# Patient Record
Sex: Male | Born: 1940
Health system: Southern US, Community
[De-identification: ages and names within clinical notes are randomized; demographics above are authoritative.]

## PROBLEM LIST (undated history)

## (undated) DIAGNOSIS — I252 Old myocardial infarction: Secondary | ICD-10-CM

## (undated) DIAGNOSIS — I2510 Atherosclerotic heart disease of native coronary artery without angina pectoris: Secondary | ICD-10-CM

## (undated) DIAGNOSIS — E785 Hyperlipidemia, unspecified: Secondary | ICD-10-CM

## (undated) DIAGNOSIS — Z9289 Personal history of other medical treatment: Secondary | ICD-10-CM

## (undated) HISTORY — PX: EYE SURGERY: SHX253

## (undated) HISTORY — DX: Old myocardial infarction: I25.2

## (undated) HISTORY — PX: BREAST SURGERY: SHX581

## (undated) HISTORY — DX: Hyperlipidemia, unspecified: E78.5

## (undated) HISTORY — DX: Personal history of other medical treatment: Z92.89

---

## 2011-03-21 ENCOUNTER — Ambulatory Visit (INDEPENDENT_AMBULATORY_CARE_PROVIDER_SITE_OTHER): Payer: PRIVATE HEALTH INSURANCE

## 2011-03-21 DIAGNOSIS — J019 Acute sinusitis, unspecified: Secondary | ICD-10-CM

## 2011-03-21 DIAGNOSIS — J209 Acute bronchitis, unspecified: Secondary | ICD-10-CM

## 2011-03-21 DIAGNOSIS — H6120 Impacted cerumen, unspecified ear: Secondary | ICD-10-CM

## 2012-01-19 ENCOUNTER — Ambulatory Visit (INDEPENDENT_AMBULATORY_CARE_PROVIDER_SITE_OTHER): Payer: Medicare Other | Admitting: Family Medicine

## 2012-01-19 VITALS — BP 142/80 | HR 61 | Temp 98.1°F | Resp 16 | Ht 68.5 in | Wt 208.0 lb

## 2012-01-19 DIAGNOSIS — S161XXA Strain of muscle, fascia and tendon at neck level, initial encounter: Secondary | ICD-10-CM

## 2012-01-19 DIAGNOSIS — S139XXA Sprain of joints and ligaments of unspecified parts of neck, initial encounter: Secondary | ICD-10-CM

## 2012-01-19 DIAGNOSIS — M542 Cervicalgia: Secondary | ICD-10-CM

## 2012-01-19 MED ORDER — METAXALONE 800 MG PO TABS: ORAL_TABLET | ORAL | Status: DC

## 2012-01-19 MED ORDER — HYDROCODONE-ACETAMINOPHEN 5-500 MG PO TABS: 1.0000 | ORAL_TABLET | ORAL | Status: DC | PRN

## 2012-01-19 NOTE — Patient Instructions (Addendum)
Heat and ice  Pain meds if needed  Aleve 1 or 2 twice daily  Muscle relaxant 1/2 to 1 twice daily and 1 at bedtime       Cervical Sprain A cervical sprain is when the ligaments in the neck stretch or tear. The ligaments are the tissues that hold the neck bones in place. HOME CARE   Put ice on the injured area.  Put ice in a plastic bag.  Place a towel between your skin and the bag.  Leave the ice on for 15 to 20 minutes, 3 to 4 times a day.  Only take medicine as told by your doctor.  Keep all doctor visits as told.  Keep all physical therapy visits as told.  If your doctor gives you a neck collar, wear it as told.  Do not drive while wearing a neck collar.  Adjust your work station so that you have good posture while you work.  Avoid positions and activities that make your problems worse.  Warm up and stretch before being active. GET HELP RIGHT AWAY IF:   You are bleeding or your stomach is upset.  You have an allergic reaction to your medicine.  Your problems (symptoms) get worse.  You develop new problems.  You lose feeling (numbness) or you cannot move (paralysis) any part of your body.  You have tingling or weakness in any part of your body.  Your pain is not controlled with medicine.  You cannot take less pain medicine over time as planned.  Your activity level does not improve as expected. MAKE SURE YOU:   Understand these instructions.  Will watch your condition.  Will get help right away if you are not doing well or get worse. Document Released: 09/17/2007 Document Revised: 06/23/2011 Document Reviewed: 01/02/2011 [facility] Patient Information 2013 [place], [place].
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
## 2012-02-10 ENCOUNTER — Ambulatory Visit (INDEPENDENT_AMBULATORY_CARE_PROVIDER_SITE_OTHER): Payer: Medicare Other | Admitting: Family Medicine

## 2012-02-10 VITALS — BP 139/74 | HR 60 | Temp 97.9°F | Resp 16 | Ht 68.5 in | Wt 209.2 lb

## 2012-02-10 DIAGNOSIS — J029 Acute pharyngitis, unspecified: Secondary | ICD-10-CM

## 2012-02-10 MED ORDER — AMOXICILLIN 875 MG PO TABS: 875.0000 mg | ORAL_TABLET | Freq: Two times a day (BID) | ORAL | Status: DC
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
## 2012-02-12 LAB — CULTURE, GROUP A STREP: Organism ID, Bacteria: NORMAL

## 2012-03-03 ENCOUNTER — Ambulatory Visit (INDEPENDENT_AMBULATORY_CARE_PROVIDER_SITE_OTHER): Payer: Medicare Other | Admitting: Family Medicine

## 2012-03-03 ENCOUNTER — Ambulatory Visit: Payer: Medicare Other

## 2012-03-03 VITALS — BP 145/90 | HR 77 | Temp 98.4°F | Resp 18 | Wt 214.0 lb

## 2012-03-03 DIAGNOSIS — S139XXA Sprain of joints and ligaments of unspecified parts of neck, initial encounter: Secondary | ICD-10-CM

## 2012-03-03 DIAGNOSIS — M545 Low back pain, unspecified: Secondary | ICD-10-CM

## 2012-03-03 DIAGNOSIS — M542 Cervicalgia: Secondary | ICD-10-CM

## 2012-03-03 DIAGNOSIS — S161XXA Strain of muscle, fascia and tendon at neck level, initial encounter: Secondary | ICD-10-CM

## 2012-03-03 DIAGNOSIS — M47812 Spondylosis without myelopathy or radiculopathy, cervical region: Secondary | ICD-10-CM

## 2012-03-03 MED ORDER — OXAPROZIN 600 MG PO TABS: ORAL_TABLET | ORAL | Status: DC

## 2012-03-03 NOTE — Patient Instructions (Addendum)
Do passive range of motion exercises stretching the neck.  Flexeril 5 mg at bedtime for muscle relaxation  Daypro one twice daily for pain and inflammation  (do not take ibuprofen or naproxen while on the Daypro)  May take tylenof for additional pain relief
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
## 2012-06-10 ENCOUNTER — Ambulatory Visit (INDEPENDENT_AMBULATORY_CARE_PROVIDER_SITE_OTHER): Payer: Medicare Other | Admitting: Emergency Medicine

## 2012-06-10 VITALS — BP 140/82 | HR 94 | Temp 99.1°F | Resp 16 | Ht 67.0 in | Wt 220.4 lb

## 2012-06-10 DIAGNOSIS — R05 Cough: Secondary | ICD-10-CM

## 2012-06-10 DIAGNOSIS — M1990 Unspecified osteoarthritis, unspecified site: Secondary | ICD-10-CM

## 2012-06-10 DIAGNOSIS — E785 Hyperlipidemia, unspecified: Secondary | ICD-10-CM | POA: Insufficient documentation

## 2012-06-10 DIAGNOSIS — J209 Acute bronchitis, unspecified: Secondary | ICD-10-CM

## 2012-06-10 MED ORDER — AZITHROMYCIN 250 MG PO TABS: ORAL_TABLET | ORAL | Status: DC

## 2012-06-10 NOTE — Patient Instructions (Addendum)

## 2012-06-10 NOTE — Progress Notes (Signed)
  Subjective:    Patient ID: Lance Hancock, male    DOB: [birth year], 72 y.o.   MRN: [id number]  HPI Pt presents to clinic today with congestion in head, cough, and drainage. These symptoms started about 5 days ago. Pt states his cough is somewhat productive.    Review of Systems     Objective:   Physical Exam HEENT exam is unremarkable. Neck supple. Chest exam reveals rhonchi but no rales no dullness .        Assessment & Plan:  Patient has cough syrup at home. He will be on a Z-Pak for infection.

## 2012-07-08 ENCOUNTER — Ambulatory Visit (INDEPENDENT_AMBULATORY_CARE_PROVIDER_SITE_OTHER): Payer: Medicare Other | Admitting: Emergency Medicine

## 2012-07-08 ENCOUNTER — Ambulatory Visit: Payer: Medicare Other

## 2012-07-08 VITALS — BP 154/71 | HR 76 | Temp 98.2°F | Resp 16 | Ht 68.0 in | Wt 218.0 lb

## 2012-07-08 DIAGNOSIS — S335XXA Sprain of ligaments of lumbar spine, initial encounter: Secondary | ICD-10-CM

## 2012-07-08 DIAGNOSIS — S139XXA Sprain of joints and ligaments of unspecified parts of neck, initial encounter: Secondary | ICD-10-CM

## 2012-07-08 MED ORDER — CYCLOBENZAPRINE HCL 10 MG PO TABS: 10.0000 mg | ORAL_TABLET | Freq: Three times a day (TID) | ORAL | Status: DC | PRN

## 2012-07-08 MED ORDER — NAPROXEN SODIUM 550 MG PO TABS: 550.0000 mg | ORAL_TABLET | Freq: Two times a day (BID) | ORAL | Status: AC
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
## 2012-07-08 NOTE — Patient Instructions (Addendum)

## 2013-01-13 ENCOUNTER — Ambulatory Visit (INDEPENDENT_AMBULATORY_CARE_PROVIDER_SITE_OTHER): Payer: Medicare Other | Admitting: Family Medicine

## 2013-01-13 DIAGNOSIS — Z23 Encounter for immunization: Secondary | ICD-10-CM

## 2013-04-16 IMAGING — CR DG CERVICAL SPINE COMPLETE 4+V
5 series · 5 of 5 positions shown · non-contrast
Comparison: None.

CLINICAL DATA: Neck and mid back pain.

CERVICAL SPINE - COMPLETE 4+ VIEW

[lpo]
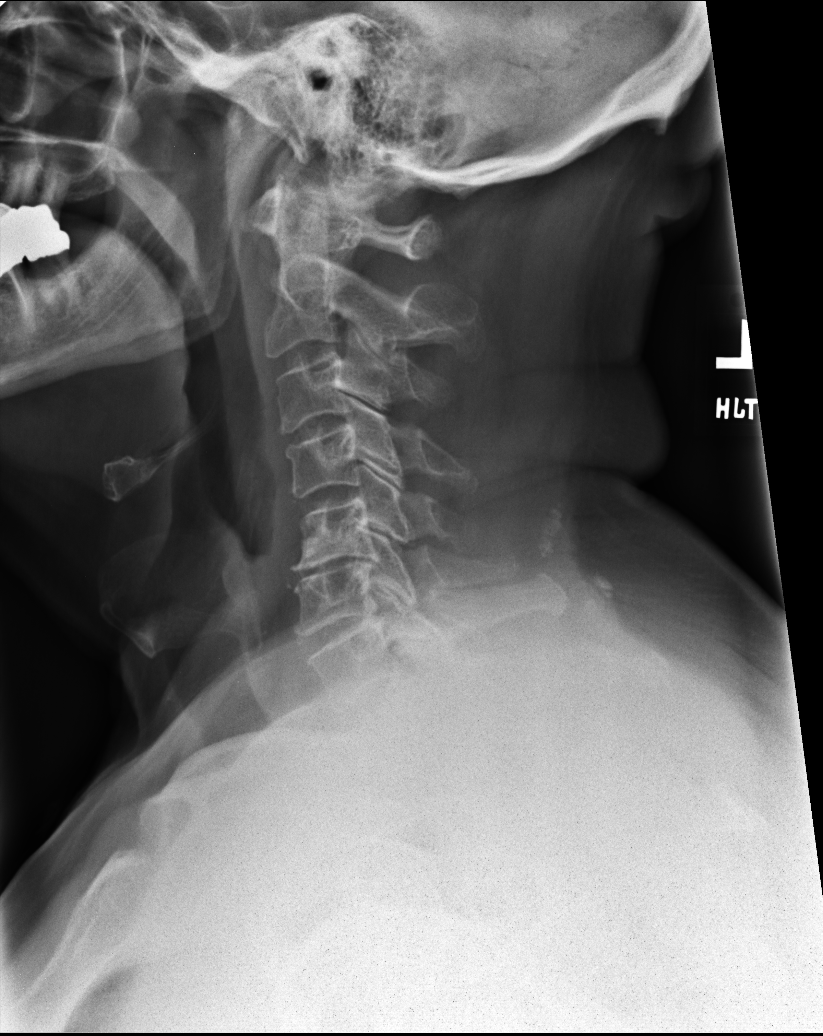

[lateral]
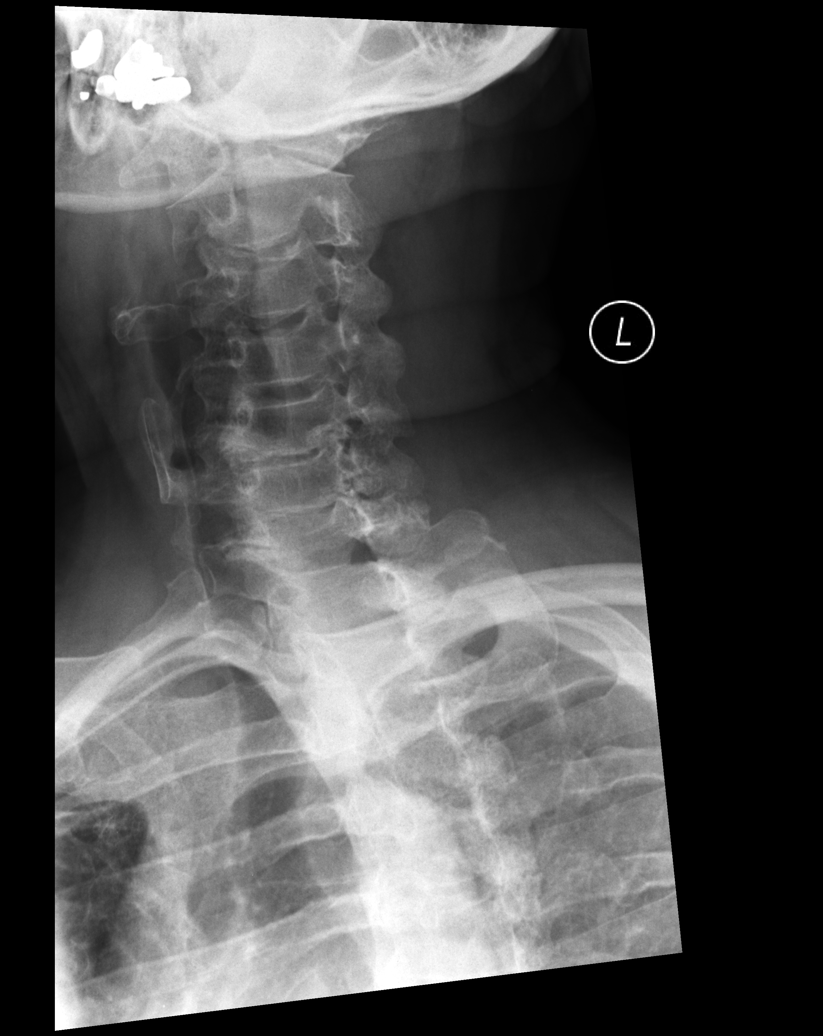

[rpo]
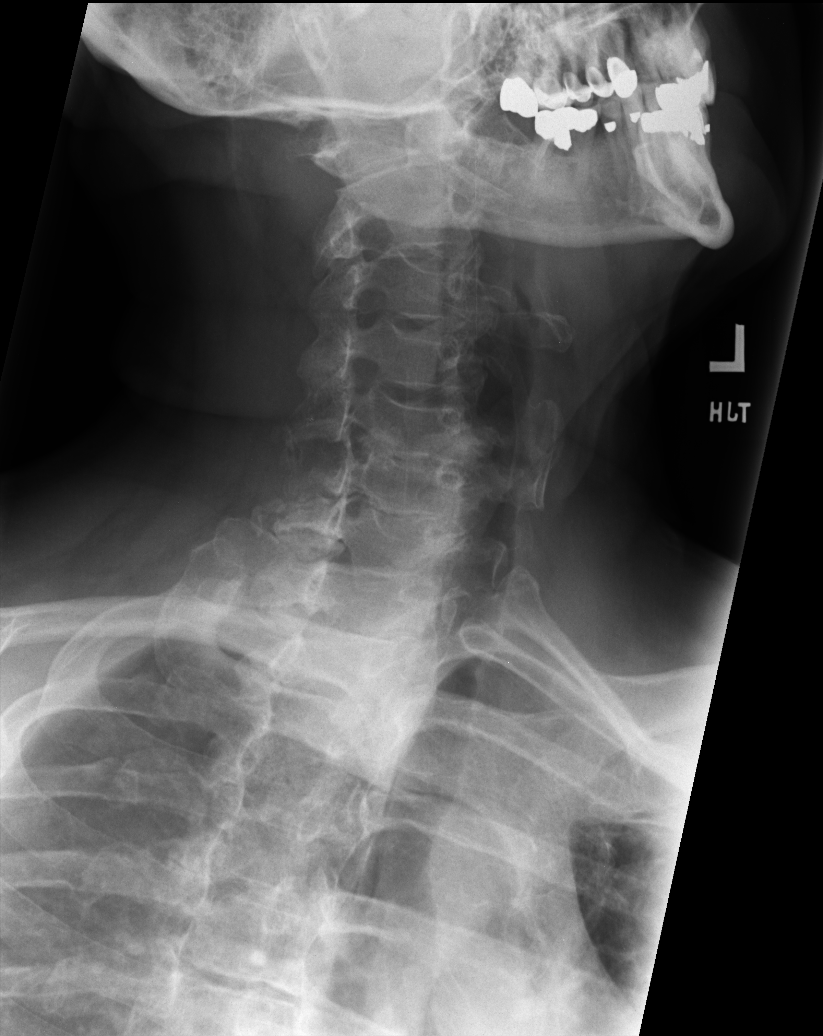

[AP]
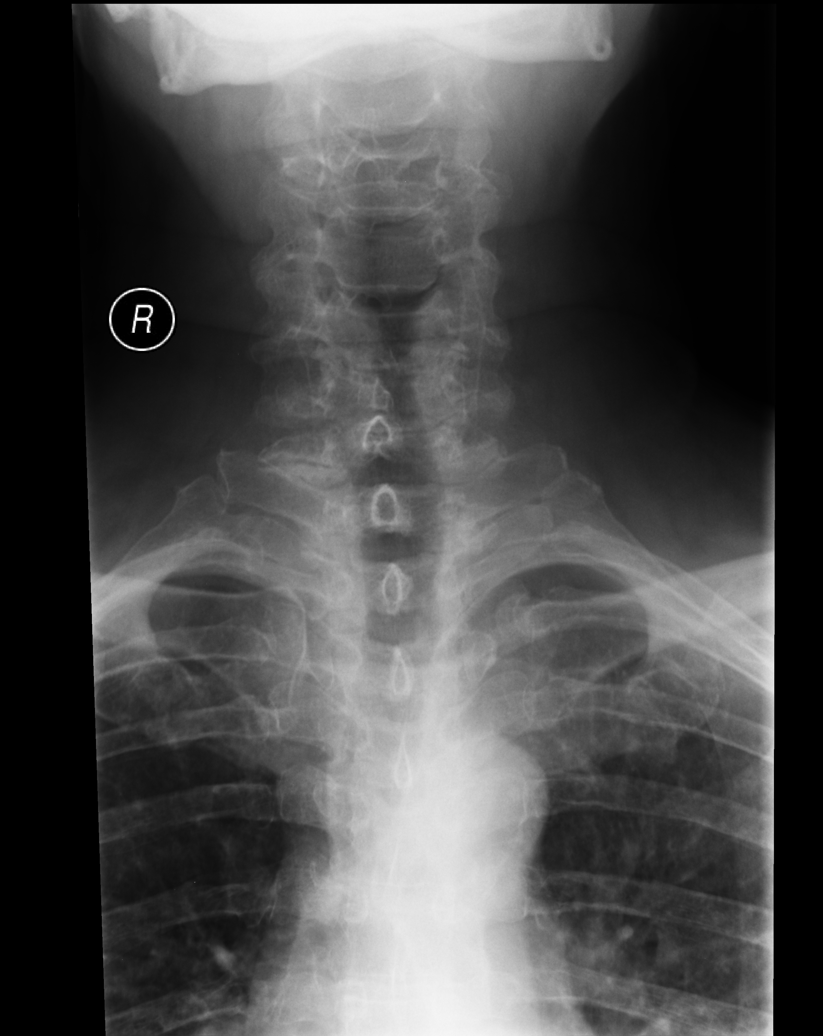

[ap open mouth]
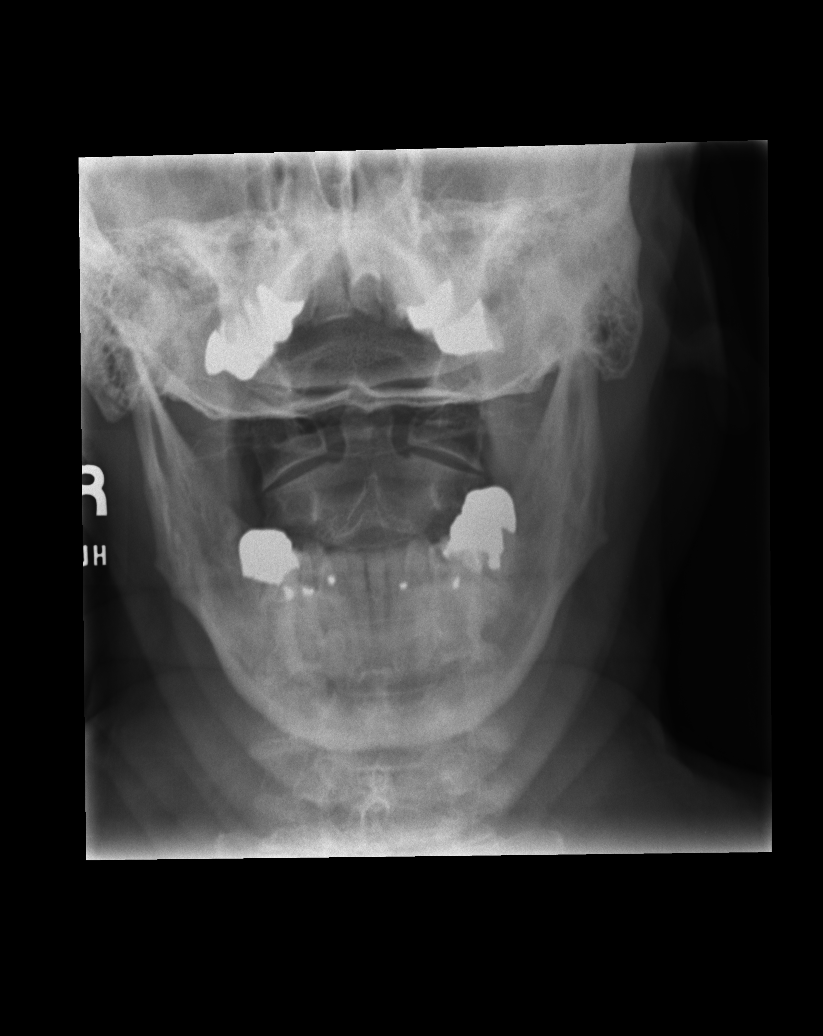

[5 of 5 positions shown; findings below may reference images not displayed]

FINDINGS: There is degenerative disc disease at C5-6 with 2 mm
retrolisthesis of C5 on C6. There is bilateral foraminal stenosis
at C5-6 and C6-7 due to uncinate spurs.  Severe right facet
arthritis at C7-T1.  Calcifications in the nuchal ligament at C6
and C7 are not significant.

No prevertebral soft tissue swelling.
IMPRESSION: Degenerative disc and joint disease in the lower cervical spine
with bilateral foraminal stenosis at C5-6 and C6-7.

## 2013-04-16 IMAGING — CR DG THORACIC SPINE 2V
2 series · 2 of 2 positions shown · non-contrast
Comparison: Chest x-ray dated 06/04/2008

CLINICAL DATA: Mid back pain and neck pain.

THORACIC SPINE - 2 VIEW

[AP]
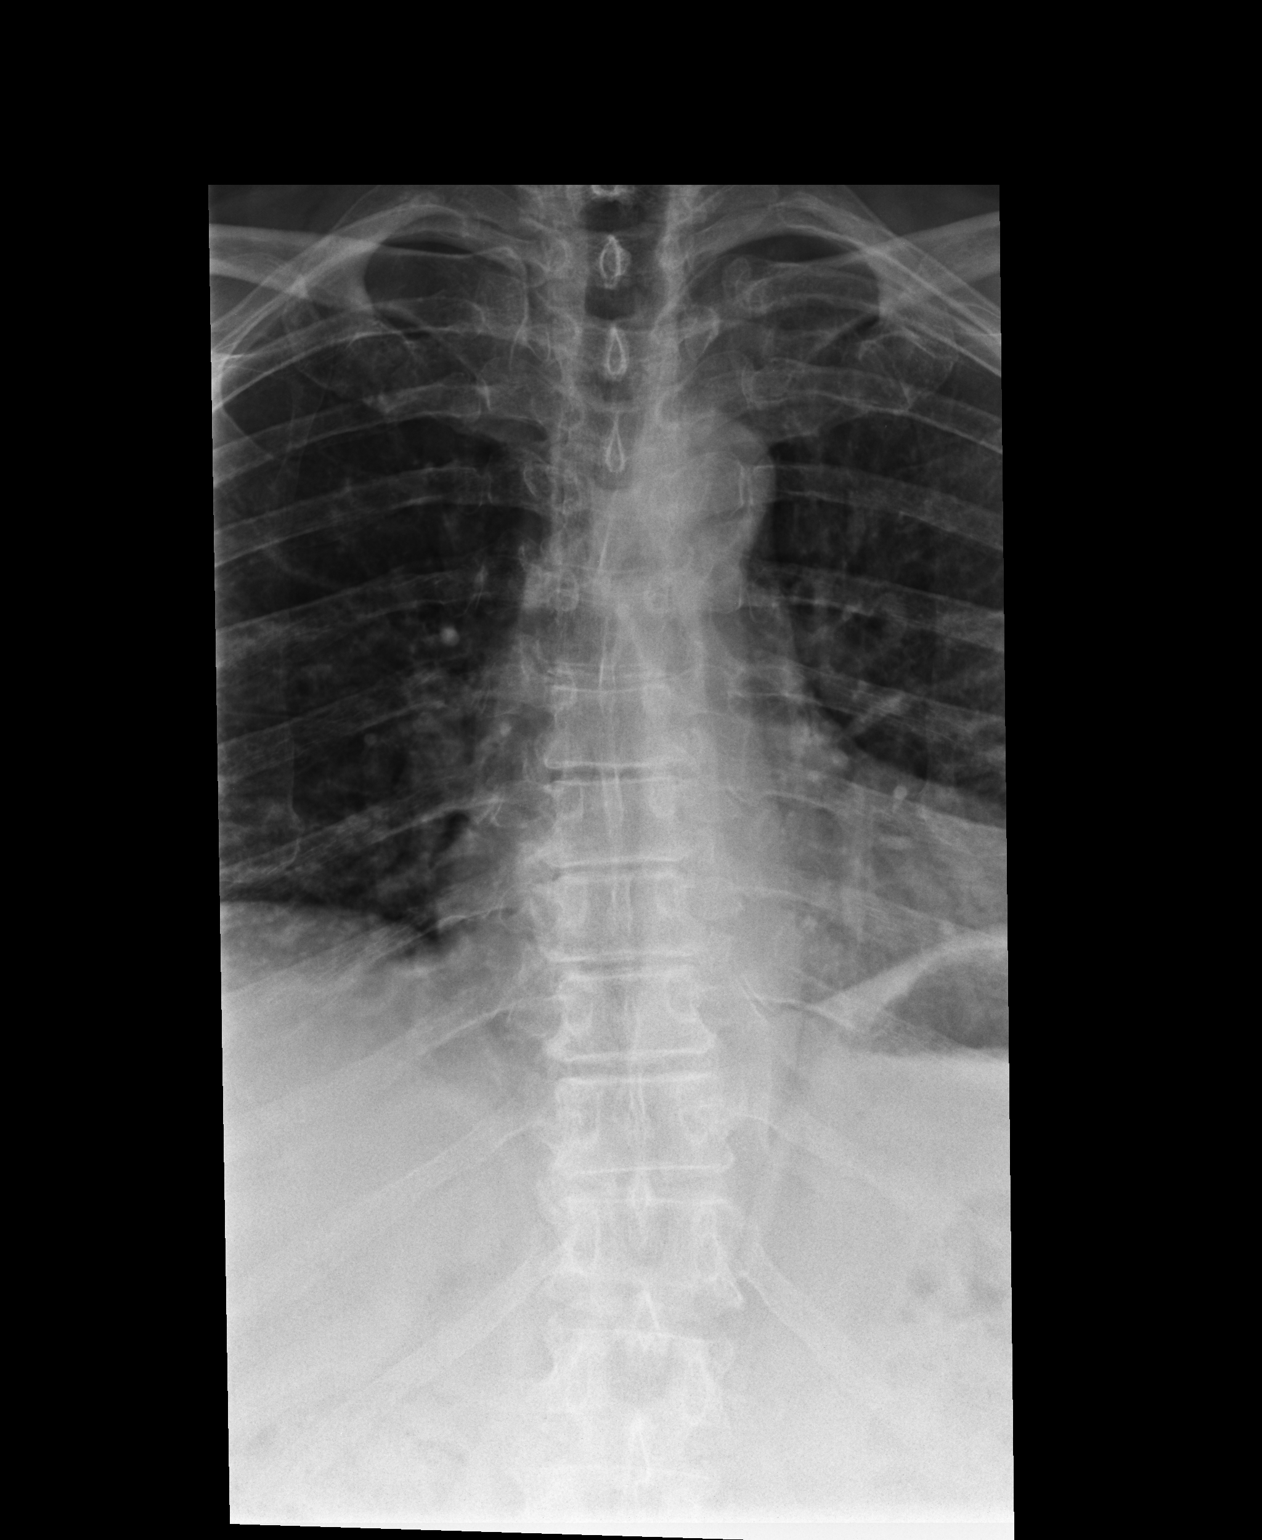

[lateral]
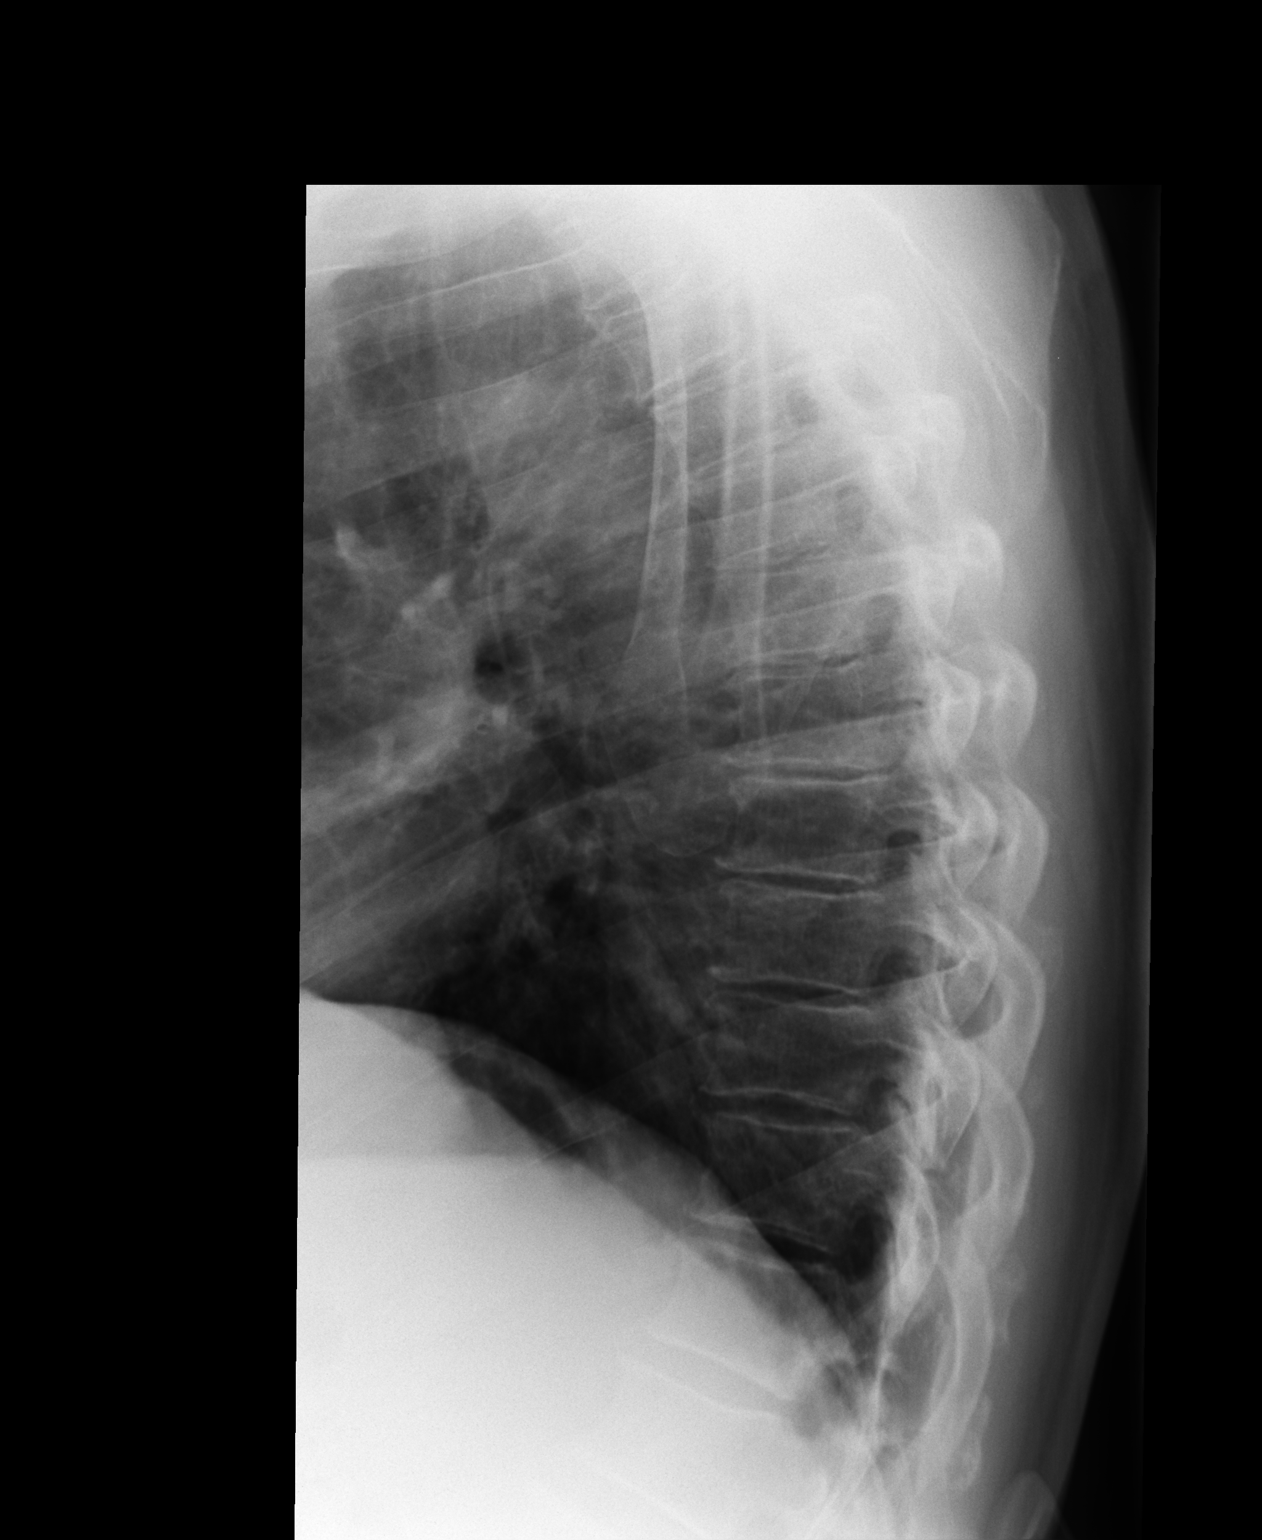

[2 of 2 positions shown; findings below may reference images not displayed]

FINDINGS: There is a minimal thoracolumbar scoliosis, unchanged.
There is no disc space narrowing, bone destruction, fracture, or
other significant abnormality.
IMPRESSION: No significant abnormality of the thoracic spine.  Severe right
facet arthritis at C7-T1.

## 2015-08-15 ENCOUNTER — Encounter (HOSPITAL_COMMUNITY): Payer: Self-pay | Admitting: Emergency Medicine

## 2015-08-15 ENCOUNTER — Inpatient Hospital Stay (HOSPITAL_COMMUNITY)
Admission: AD | Admit: 2015-08-15 | Discharge: 2015-08-16 | DRG: 247 | Disposition: A | Discharge status: Home / Self Care | Payer: Medicare HMO | Attending: Cardiovascular Disease | Admitting: Cardiovascular Disease

## 2015-08-15 ENCOUNTER — Ambulatory Visit (HOSPITAL_COMMUNITY): Admit: 2015-08-15 | Payer: Self-pay | Admitting: Cardiovascular Disease

## 2015-08-15 ENCOUNTER — Encounter (HOSPITAL_COMMUNITY)
Admission: AD | Disposition: A | Discharge status: Home / Self Care | Payer: Self-pay | Source: Home / Self Care | Attending: Cardiovascular Disease

## 2015-08-15 DIAGNOSIS — Z79899 Other long term (current) drug therapy: Secondary | ICD-10-CM | POA: Diagnosis not present

## 2015-08-15 DIAGNOSIS — I252 Old myocardial infarction: Secondary | ICD-10-CM

## 2015-08-15 DIAGNOSIS — I2510 Atherosclerotic heart disease of native coronary artery without angina pectoris: Secondary | ICD-10-CM

## 2015-08-15 DIAGNOSIS — Z955 Presence of coronary angioplasty implant and graft: Secondary | ICD-10-CM

## 2015-08-15 DIAGNOSIS — I213 ST elevation (STEMI) myocardial infarction of unspecified site: Secondary | ICD-10-CM

## 2015-08-15 DIAGNOSIS — I2111 ST elevation (STEMI) myocardial infarction involving right coronary artery: Secondary | ICD-10-CM

## 2015-08-15 DIAGNOSIS — I2119 ST elevation (STEMI) myocardial infarction involving other coronary artery of inferior wall: Secondary | ICD-10-CM | POA: Diagnosis present

## 2015-08-15 DIAGNOSIS — Z7982 Long term (current) use of aspirin: Secondary | ICD-10-CM | POA: Diagnosis not present

## 2015-08-15 DIAGNOSIS — Z87891 Personal history of nicotine dependence: Secondary | ICD-10-CM | POA: Diagnosis not present

## 2015-08-15 DIAGNOSIS — E785 Hyperlipidemia, unspecified: Secondary | ICD-10-CM | POA: Diagnosis present

## 2015-08-15 DIAGNOSIS — R001 Bradycardia, unspecified: Secondary | ICD-10-CM | POA: Diagnosis present

## 2015-08-15 HISTORY — DX: Old myocardial infarction: I25.2

## 2015-08-15 HISTORY — PX: CARDIAC CATHETERIZATION: SHX172

## 2015-08-15 HISTORY — DX: Atherosclerotic heart disease of native coronary artery without angina pectoris: I25.10

## 2015-08-15 LAB — POCT I-STAT, CHEM 8
BUN: 24 mg/dL — ABNORMAL HIGH (ref 6–20)
Calcium, Ion: 1.03 mmol/L — ABNORMAL LOW (ref 1.13–1.30)
Chloride: 103 mmol/L (ref 101–111)
Creatinine, Ser: 0.9 mg/dL (ref 0.61–1.24)
GLUCOSE: 122 mg/dL — AB (ref 65–99)
HCT: 45 % (ref 39.0–52.0)
HEMOGLOBIN: 15.3 g/dL (ref 13.0–17.0)
POTASSIUM: 3.2 mmol/L — AB (ref 3.5–5.1)
SODIUM: 138 mmol/L (ref 135–145)
TCO2: 20 mmol/L (ref 0–100)

## 2015-08-15 LAB — BASIC METABOLIC PANEL
ANION GAP: 8 (ref 5–15)
Anion gap: 12 (ref 5–15)
BUN: 23 mg/dL — ABNORMAL HIGH (ref 6–20)
BUN: UNDETERMINED mg/dL (ref 6–20)
BUN: 14 mg/dL (ref 6–20)
CALCIUM: 8.7 mg/dL — AB (ref 8.9–10.3)
CALCIUM: 8.8 mg/dL — AB (ref 8.9–10.3)
CALCIUM: UNDETERMINED mg/dL (ref 8.9–10.3)
CO2: 21 mmol/L — ABNORMAL LOW (ref 22–32)
CO2: UNDETERMINED mmol/L (ref 22–32)
CO2: 24 mmol/L (ref 22–32)
Chloride: 104 mmol/L (ref 101–111)
Chloride: UNDETERMINED mmol/L (ref 101–111)
Chloride: 106 mmol/L (ref 101–111)
Creatinine, Ser: 0.96 mg/dL (ref 0.61–1.24)
Creatinine, Ser: 0.82 mg/dL (ref 0.61–1.24)
Creatinine, Ser: 0.9 mg/dL (ref 0.61–1.24)
GLUCOSE: 111 mg/dL — AB (ref 65–99)
Glucose, Bld: 129 mg/dL — ABNORMAL HIGH (ref 65–99)
Glucose, Bld: 123 mg/dL — ABNORMAL HIGH (ref 65–99)
POTASSIUM: 3.3 mmol/L — AB (ref 3.5–5.1)
Potassium: UNDETERMINED mmol/L (ref 3.5–5.1)
Potassium: 3.8 mmol/L (ref 3.5–5.1)
SODIUM: UNDETERMINED mmol/L (ref 135–145)
SODIUM: 138 mmol/L (ref 135–145)
Sodium: 137 mmol/L (ref 135–145)

## 2015-08-15 LAB — TROPONIN I
TROPONIN I: 6.42 ng/mL — AB (ref <0.031)
Troponin I: 17.31 ng/mL (ref <0.031)
Troponin I: 12.1 ng/mL (ref <0.031)

## 2015-08-15 LAB — POCT ACTIVATED CLOTTING TIME
Activated Clotting Time: 214 seconds
Activated Clotting Time: 312 seconds

## 2015-08-15 LAB — CBC WITH DIFFERENTIAL/PLATELET
BASOS ABS: 0 10*3/uL (ref 0.0–0.1)
BASOS PCT: 0 %
Eosinophils Absolute: 0.1 10*3/uL (ref 0.0–0.7)
Eosinophils Relative: 2 %
HEMATOCRIT: 42.9 % (ref 39.0–52.0)
HEMOGLOBIN: 14.5 g/dL (ref 13.0–17.0)
LYMPHS PCT: 48 %
Lymphs Abs: 3.6 10*3/uL (ref 0.7–4.0)
MCH: 30 pg (ref 26.0–34.0)
MCHC: 33.8 g/dL (ref 30.0–36.0)
MCV: 88.8 fL (ref 78.0–100.0)
MONO ABS: 0.7 10*3/uL (ref 0.1–1.0)
Monocytes Relative: 10 %
NEUTROS ABS: 3 10*3/uL (ref 1.7–7.7)
NEUTROS PCT: 40 %
Platelets: 162 10*3/uL (ref 150–400)
RBC: 4.83 MIL/uL (ref 4.22–5.81)
RDW: 13.1 % (ref 11.5–15.5)
WBC: 7.5 10*3/uL (ref 4.0–10.5)

## 2015-08-15 LAB — LIPID PANEL
Cholesterol: 138 mg/dL (ref 0–200)
HDL: 43 mg/dL (ref >40)
LDL Cholesterol: 90 mg/dL (ref 0–99)
TRIGLYCERIDES: 26 mg/dL (ref <150)
Total CHOL/HDL Ratio: 3.2 RATIO
VLDL: 5 mg/dL (ref 0–40)

## 2015-08-15 LAB — CBC
HCT: 44.1 % (ref 39.0–52.0)
Hemoglobin: 15.3 g/dL (ref 13.0–17.0)
MCH: 31.4 pg (ref 26.0–34.0)
MCHC: 34.7 g/dL (ref 30.0–36.0)
MCV: 90.6 fL (ref 78.0–100.0)
Platelets: 167 10*3/uL (ref 150–400)
RBC: 4.87 MIL/uL (ref 4.22–5.81)
RDW: 13.3 % (ref 11.5–15.5)
WBC: 9.9 10*3/uL (ref 4.0–10.5)

## 2015-08-15 LAB — POCT I-STAT TROPONIN I: TROPONIN I, POC: 0.02 ng/mL (ref 0.00–0.08)

## 2015-08-15 LAB — TSH: TSH: 1.889 u[IU]/mL (ref 0.350–4.500)

## 2015-08-15 LAB — PROTIME-INR
INR: 1.1 (ref 0.00–1.49)
Prothrombin Time: 14.4 seconds (ref 11.6–15.2)

## 2015-08-15 LAB — MRSA PCR SCREENING: MRSA BY PCR: POSITIVE — AB

## 2015-08-15 LAB — APTT: APTT: 25 s (ref 24–37)

## 2015-08-15 SURGERY — LEFT HEART CATH AND CORONARY ANGIOGRAPHY

## 2015-08-15 MED ORDER — IOPAMIDOL (ISOVUE-370) INJECTION 76%
INTRAVENOUS | Status: AC
  Filled 2015-08-15: qty 125

## 2015-08-15 MED ORDER — TICAGRELOR 90 MG PO TABS
ORAL_TABLET | ORAL | Status: DC | PRN
  Administered 2015-08-15: 180 mg via ORAL

## 2015-08-15 MED ORDER — TIROFIBAN HCL IN NACL 5-0.9 MG/100ML-% IV SOLN
INTRAVENOUS | Status: AC
  Filled 2015-08-15: qty 100

## 2015-08-15 MED ORDER — CHLORHEXIDINE GLUCONATE CLOTH 2 % EX PADS
6.0000 | MEDICATED_PAD | Freq: Every day | CUTANEOUS | Status: DC
  Administered 2015-08-16: 6 via TOPICAL

## 2015-08-15 MED ORDER — SODIUM CHLORIDE 0.9% FLUSH: 3.0000 mL | Freq: Two times a day (BID) | INTRAVENOUS | Status: DC

## 2015-08-15 MED ORDER — OXYCODONE-ACETAMINOPHEN 5-325 MG PO TABS: 1.0000 | ORAL_TABLET | ORAL | Status: DC | PRN

## 2015-08-15 MED ORDER — FENTANYL CITRATE (PF) 100 MCG/2ML IJ SOLN
INTRAMUSCULAR | Status: DC | PRN
  Administered 2015-08-15: 25 ug via INTRAVENOUS

## 2015-08-15 MED ORDER — VERAPAMIL HCL 2.5 MG/ML IV SOLN
INTRAVENOUS | Status: AC
  Filled 2015-08-15: qty 2

## 2015-08-15 MED ORDER — VERAPAMIL HCL 2.5 MG/ML IV SOLN
INTRAVENOUS | Status: DC | PRN
  Administered 2015-08-15: 01:00:00 via INTRA_ARTERIAL

## 2015-08-15 MED ORDER — CYCLOBENZAPRINE HCL 10 MG PO TABS: 10.0000 mg | ORAL_TABLET | Freq: Three times a day (TID) | ORAL | Status: DC | PRN

## 2015-08-15 MED ORDER — HEPARIN SODIUM (PORCINE) 5000 UNIT/ML IJ SOLN
5000.0000 [IU] | Freq: Three times a day (TID) | INTRAMUSCULAR | Status: DC
  Administered 2015-08-15 – 2015-08-16 (×4): 5000 [IU] via SUBCUTANEOUS
  Filled 2015-08-15 (×4): qty 1

## 2015-08-15 MED ORDER — IOPAMIDOL (ISOVUE-370) INJECTION 76%
INTRAVENOUS | Status: AC
  Filled 2015-08-15: qty 50

## 2015-08-15 MED ORDER — ASPIRIN 81 MG PO CHEW
81.0000 mg | CHEWABLE_TABLET | Freq: Every day | ORAL | Status: DC
  Administered 2015-08-15 – 2015-08-16 (×2): 81 mg via ORAL
  Filled 2015-08-15 (×2): qty 1

## 2015-08-15 MED ORDER — ATROPINE SULFATE 1 MG/10ML IJ SOSY
PREFILLED_SYRINGE | INTRAMUSCULAR | Status: AC
  Filled 2015-08-15: qty 10

## 2015-08-15 MED ORDER — ATORVASTATIN CALCIUM 80 MG PO TABS
80.0000 mg | ORAL_TABLET | Freq: Every day | ORAL | Status: DC
  Administered 2015-08-15: 80 mg via ORAL
  Filled 2015-08-15: qty 1

## 2015-08-15 MED ORDER — HEPARIN (PORCINE) IN NACL 2-0.9 UNIT/ML-% IJ SOLN
INTRAMUSCULAR | Status: DC | PRN
  Administered 2015-08-15: 1500 mL

## 2015-08-15 MED ORDER — SODIUM CHLORIDE 0.9 % IV BOLUS (SEPSIS)
1000.0000 mL | Freq: Once | INTRAVENOUS | Status: AC
  Administered 2015-08-15: 1000 mL via INTRAVENOUS

## 2015-08-15 MED ORDER — HEPARIN SODIUM (PORCINE) 1000 UNIT/ML IJ SOLN
INTRAMUSCULAR | Status: DC | PRN
  Administered 2015-08-15 (×2): 5000 [IU] via INTRAVENOUS

## 2015-08-15 MED ORDER — OMEGA-3 FATTY ACIDS 1000 MG PO CAPS: 2.0000 g | ORAL_CAPSULE | Freq: Every day | ORAL | Status: DC

## 2015-08-15 MED ORDER — TICAGRELOR 90 MG PO TABS
ORAL_TABLET | ORAL | Status: AC
  Filled 2015-08-15: qty 2

## 2015-08-15 MED ORDER — FENTANYL CITRATE (PF) 100 MCG/2ML IJ SOLN
INTRAMUSCULAR | Status: AC
  Filled 2015-08-15: qty 2

## 2015-08-15 MED ORDER — TIROFIBAN (AGGRASTAT) BOLUS VIA INFUSION
INTRAVENOUS | Status: DC | PRN
  Administered 2015-08-15: 2427.5 ug via INTRAVENOUS

## 2015-08-15 MED ORDER — NITROGLYCERIN 0.4 MG SL SUBL: 0.4000 mg | SUBLINGUAL_TABLET | SUBLINGUAL | Status: DC | PRN

## 2015-08-15 MED ORDER — NITROGLYCERIN 1 MG/10 ML FOR IR/CATH LAB
INTRA_ARTERIAL | Status: AC
  Filled 2015-08-15: qty 10

## 2015-08-15 MED ORDER — MIDAZOLAM HCL 2 MG/2ML IJ SOLN
INTRAMUSCULAR | Status: AC
  Filled 2015-08-15: qty 2

## 2015-08-15 MED ORDER — SODIUM CHLORIDE 0.9 % WEIGHT BASED INFUSION
3.0000 mL/kg/h | INTRAVENOUS | Status: DC
  Administered 2015-08-15: 3 mL/kg/h via INTRAVENOUS

## 2015-08-15 MED ORDER — LIDOCAINE HCL (PF) 1 % IJ SOLN
INTRAMUSCULAR | Status: AC
  Filled 2015-08-15: qty 30

## 2015-08-15 MED ORDER — TICAGRELOR 90 MG PO TABS
90.0000 mg | ORAL_TABLET | Freq: Two times a day (BID) | ORAL | Status: DC
  Administered 2015-08-15 – 2015-08-16 (×3): 90 mg via ORAL
  Filled 2015-08-15 (×3): qty 1

## 2015-08-15 MED ORDER — ATROPINE SULFATE 1 MG/10ML IJ SOSY
PREFILLED_SYRINGE | INTRAMUSCULAR | Status: DC | PRN
  Administered 2015-08-15 (×2): 0.5 mg via INTRAVENOUS

## 2015-08-15 MED ORDER — METOPROLOL TARTRATE 12.5 MG HALF TABLET
12.5000 mg | ORAL_TABLET | Freq: Two times a day (BID) | ORAL | Status: DC
  Administered 2015-08-15 – 2015-08-16 (×3): 12.5 mg via ORAL
  Filled 2015-08-15 (×3): qty 1

## 2015-08-15 MED ORDER — LIDOCAINE HCL (PF) 1 % IJ SOLN
INTRAMUSCULAR | Status: DC | PRN
  Administered 2015-08-15: 2 mL

## 2015-08-15 MED ORDER — ONDANSETRON HCL 4 MG/2ML IJ SOLN: 4.0000 mg | Freq: Four times a day (QID) | INTRAMUSCULAR | Status: DC | PRN

## 2015-08-15 MED ORDER — MIDAZOLAM HCL 2 MG/2ML IJ SOLN
INTRAMUSCULAR | Status: DC | PRN
  Administered 2015-08-15: 2 mg via INTRAVENOUS

## 2015-08-15 MED ORDER — HEPARIN SODIUM (PORCINE) 5000 UNIT/ML IJ SOLN
5000.0000 [IU] | Freq: Once | INTRAMUSCULAR | Status: AC
  Administered 2015-08-15: 5000 [IU] via INTRAVENOUS

## 2015-08-15 MED ORDER — ACETAMINOPHEN 325 MG PO TABS: 650.0000 mg | ORAL_TABLET | ORAL | Status: DC | PRN

## 2015-08-15 MED ORDER — NITROGLYCERIN 1 MG/10 ML FOR IR/CATH LAB
INTRA_ARTERIAL | Status: DC | PRN
  Administered 2015-08-15 (×2): 200 ug via INTRACORONARY

## 2015-08-15 MED ORDER — MUPIROCIN 2 % EX OINT
1.0000 "application " | TOPICAL_OINTMENT | Freq: Two times a day (BID) | CUTANEOUS | Status: DC
  Administered 2015-08-15 – 2015-08-16 (×3): 1 via NASAL
  Filled 2015-08-15: qty 22

## 2015-08-15 MED ORDER — SODIUM CHLORIDE 0.9 % IV SOLN: 250.0000 mL | INTRAVENOUS | Status: DC | PRN

## 2015-08-15 MED ORDER — OMEGA-3-ACID ETHYL ESTERS 1 G PO CAPS
2.0000 g | ORAL_CAPSULE | Freq: Every day | ORAL | Status: DC
  Administered 2015-08-15 – 2015-08-16 (×2): 2 g via ORAL
  Filled 2015-08-15 (×2): qty 2

## 2015-08-15 MED ORDER — IOPAMIDOL (ISOVUE-370) INJECTION 76%
INTRAVENOUS | Status: DC | PRN
  Administered 2015-08-15: 160 mL via INTRA_ARTERIAL

## 2015-08-15 MED ORDER — HEPARIN SODIUM (PORCINE) 1000 UNIT/ML IJ SOLN
INTRAMUSCULAR | Status: AC
  Filled 2015-08-15: qty 1

## 2015-08-15 MED ORDER — HEPARIN (PORCINE) IN NACL 2-0.9 UNIT/ML-% IJ SOLN
INTRAMUSCULAR | Status: AC
  Filled 2015-08-15: qty 1500

## 2015-08-15 MED ORDER — SODIUM CHLORIDE 0.9% FLUSH: 3.0000 mL | INTRAVENOUS | Status: DC | PRN

## 2015-08-15 SURGICAL SUPPLY — 23 items
BALLN EMERGE MR 2.0X15 (BALLOONS) ×3
BALLN ~~LOC~~ EUPHORA RX 3.75X15 (BALLOONS) ×3
BALLOON EMERGE MR 2.0X15 (BALLOONS) ×1 IMPLANT
BALLOON ~~LOC~~ EUPHORA RX 3.75X15 (BALLOONS) ×1 IMPLANT
CATH EXTRAC PRONTO 5.5F 138CM (CATHETERS) ×3 IMPLANT
CATH INFINITI 5 FR JL3.5 (CATHETERS) ×3 IMPLANT
CATH INFINITI 5FR ANG PIGTAIL (CATHETERS) ×3 IMPLANT
CATH INFINITI JR4 5F (CATHETERS) ×3 IMPLANT
CATH VISTA GUIDE 6FR JR4 (CATHETERS) ×3 IMPLANT
DEVICE RAD COMP TR BAND LRG (VASCULAR PRODUCTS) ×3 IMPLANT
GLIDESHEATH SLEND SS 6F .021 (SHEATH) ×3 IMPLANT
KIT ENCORE 26 ADVANTAGE (KITS) ×3 IMPLANT
KIT HEART LEFT (KITS) ×3 IMPLANT
PACK CARDIAC CATHETERIZATION (CUSTOM PROCEDURE TRAY) ×3 IMPLANT
STENT PROMUS PREM MR 3.0X20 (Permanent Stent) ×3 IMPLANT
STENT PROMUS PREM MR 3.5X16 (Permanent Stent) ×3 IMPLANT
SYR MEDRAD MARK V 150ML (SYRINGE) ×3 IMPLANT
TRANSDUCER W/STOPCOCK (MISCELLANEOUS) ×3 IMPLANT
TUBING CIL FLEX 10 FLL-RA (TUBING) ×3 IMPLANT
WIRE COUGAR XT STRL 190CM (WIRE) ×3 IMPLANT
WIRE HI TORQ VERSACORE-J 145CM (WIRE) ×3 IMPLANT
WIRE HI TORQ WHISPER MS 190CM (WIRE) ×3 IMPLANT
WIRE SAFE-T 1.5MM-J .035X260CM (WIRE) ×3 IMPLANT

## 2015-08-15 NOTE — Progress Notes (Signed)
    Subjective:  Feels good this am. No CP or dyspnea.   Objective:  Vital Signs in the last 24 hours: Temp:  [98.1 F (36.7 C)-98.2 F (36.8 C)] 98.1 F (36.7 C) (05/03 0345) Pulse Rate:  [38-81] 79 (05/03 0223) Resp:  [11-30] 16 (05/03 0700) BP: (108-157)/(59-111) 108/59 mmHg (05/03 0700) SpO2:  [88 %-100 %] 93 % (05/03 0700) Weight:  [214 lb (97.07 kg)-215 lb 2.7 oz (97.6 kg)] 215 lb 2.7 oz (97.6 kg) (05/03 0300)  Intake/Output from previous day: 05/02 0701 - 05/03 0700 In: 451.5 [I.V.:451.5] Out: 1400 [Urine:1400]  Physical Exam: Pt is alert and oriented, NAD HEENT: normal Neck: JVP - normal Lungs: CTA bilaterally CV: RRR without murmur or gallop Abd: soft, NT, Positive BS, no hepatomegaly Ext: no C/C/E, distal pulses intact and equal Skin: warm/dry no rash   Lab Results:  Recent Labs  08/15/15 0058 08/15/15 0120 08/15/15 0333  WBC 7.5  --  9.9  HGB 14.5 15.3 15.3  PLT 162  --  167    Recent Labs  08/15/15 0058 08/15/15 0120 08/15/15 0333  NA 137 138 QUANTITY NOT SUFFICIENT, UNABLE TO PERFORM TEST  K 3.3* 3.2* QUANTITY NOT SUFFICIENT, UNABLE TO PERFORM TEST  CL 104 103 QUANTITY NOT SUFFICIENT, UNABLE TO PERFORM TEST  CO2 21*  --  QUANTITY NOT SUFFICIENT, UNABLE TO PERFORM TEST  GLUCOSE 129* 122* 123*  BUN 23* 24* QUANTITY NOT SUFFICIENT, UNABLE TO PERFORM TEST  CREATININE 0.96 0.90 0.82    Recent Labs  08/15/15 0333  TROPONINI 6.42*    Cardiac Studies: Cath findings noted  Tele: Normal sinus rhythm  Assessment/Plan:  1. Acute inferior STEMI: s/p primary PCI with overlapping DES. Preserved LV function. Hemodynamically stable. Continue ASA, brilinta, statin, beta-blocker. Tx tele today  2. Hyperlipidemia: changed to high-dose atorvastatin. Lengthy discussion with patient about secondary risk reduction measures.  [name], M.D. 08/15/2015, 9:04 AM

## 2015-08-15 NOTE — H&P (Signed)
    CARDIOLOGY ADMISSION NOTE  Assessment and Plan:  * Inferior ST elevation myocardial infarction: s/p urgent LHC and coronary angiography with PCI to  - Continue aspirin 81mg  daily - Continue brilinta 180mg  twice daily - Change simvastatin to atorvastatin 80mg  daily - Hold off on beta blockers due to relative bradycardia  - Will get an ECHO in AM - Check FLP, HgbA1c - Monitor in CICU  *Dyslipidemia: On Simvastatin. Will change to atorvastatin based on ACC/AHA guidelines.  *FEN GI: Heart healthy diet, IV fluids  *Code Status: Full code  Chief complaint: chest pain   HPI:  Lance Hancock is a 75 year old male with history of dyslpidemia brought to the emergency department with an acute onset chest pain. Patient was feeling fairly well up until 12:15 this morning when he had acute onset substernal chest pain with radiation to his jaw with associated shortness of breath and diaphoresis. He contacted EMS who performed an EKG that showed inferior ST elevations with reciprocal changes in the lateral leads suggestive of inferior ST elevation myocardial infarction. He was given full dose aspirin and due to relative hypotension initially he was not given any sublingual nitroglycerin. Upon my evaluation in the ED, patient continues to have substernal active chest pressure with associated nausea. EKG confirms inferior ST elevation microinfarction. He was treated with 5000 intravenous units of heparin. He was urgently taken to the cardiac Cath Lab and underwent a PCI  Previous cardiac imaging  EKG: Sinus bradycardia with 1 mm ST elevations in II, III and aVF with reciprocal changes in 1 and aVL  Past Medical History Dyslipidemia  Allergies: No Known Allergies  Social History Social History   Social History  . Marital Status: Married    Spouse Name: N/A  . Number of Children: N/A  . Years of Education: N/A   Occupational History  . Not on file.   Social History Main Topics  .  Smoking status: Former Smoker -- 2.00 packs/day for 18 years    Types: Cigarettes    Quit date: 01/19/1976  . Smokeless tobacco: Not on file  . Alcohol Use: Yes     Comment: occasional  . Drug Use: No  . Sexual Activity:    Partners: Female   Other Topics Concern  . Not on file   Social History Narrative   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    Family History No family history on file.  Physical Exam  Gen: Comfortable appearing in no acute distress HEENT: Extraocular motions intact, pupils equal round react to light, moist because membranes Neck: Supple, difficult to evaluate for JVD due to beard, no carotid bruits CV: bradycardiac, normal S1, normal S2, no obvious murmurs Pulm: CTAB with normal work of breathing Abdomen: Soft, nontender, nondistended Ext: No cyanosis clubbing or edema  Labs:  No results found for this or any previous visit (from the past 24 hour(s)).    Note above as per Dr Posey Pronto.  Lance Hancock 08/15/2015 11:35 AM

## 2015-08-15 NOTE — Progress Notes (Signed)
   08/15/15 0300  Clinical Encounter Type  Visited With Family  Visit Type ED  Referral From Nurse  Spiritual Encounters  Spiritual Needs Other (Comment) (logistical support)  Stress Factors  Family Stress Factors Lack of knowledge;Health changes  Chaplain called to code stemi, escorted wife and family friend up to cath lab waiting room, provided hospitality. Onur Mori, Chaplain

## 2015-08-15 NOTE — Care Management Note (Signed)
Case Management Note  Patient Details  Name: Lance Hancock MRN: [id number] Date of Birth: January 10, 1941  Subjective/Objective:    Adm w stemi                Action/Plan: lives w wife, pcp dr guest   Expected Discharge Date:                  Expected Discharge Plan:  Home/Self Care  In-House Referral:     Discharge planning Services  CM Consult, Medication Assistance  Post Acute Care Choice:    Choice offered to:     DME Arranged:    DME Agency:     HH Arranged:    [place] Agency:     Status of Service:     Medicare Important Message Given:    Date Medicare IM Given:    Medicare IM give by:    Date Additional Medicare IM Given:    Additional Medicare Important Message give by:     If discussed at [place] of Stay Meetings, dates discussed:    Additional Comments: ur review done. Gave pt 30day free brilinta card.  Lacretia Leigh, RN 08/15/2015, 10:44 AM

## 2015-08-15 NOTE — Research (Signed)
DAL-GeNE Informed Consent   Subject Name: Lance Hancock  Subject met inclusion and exclusion criteria.  The informed consent form, study requirements and expectations were reviewed with the subject and questions and concerns were addressed prior to the signing of the consent form.  The subject verbalized understanding of the trail requirements.  The subject agreed to participate in the DAL-GeNE trial and signed the informed consent.  The informed consent was obtained prior to performance of any protocol-specific procedures for the subject.  A copy of the signed informed consent was given to the subject and a copy was placed in the subject's medical record.  Hedrick,Pascal Stiggers W 08/15/2015, 4:14 PM

## 2015-08-15 NOTE — ED Notes (Signed)
Pt coming from home. Pt had just laid down in the bed to go to sleep. Pt started experience severe chest pain on the left side that radiated to his jaw.   324 ASA and 4 zofran prior to arrival.  18 RAC 20 LAC 140/78 84/54  10L NRB 8/10 pain 113 CBG 8/10 pain  Pt has been bradycardic

## 2015-08-15 NOTE — Progress Notes (Signed)
CRITICAL VALUE ALERT  Critical value received: Troponin   Date of notification:  08/15/15  Time of notification:  0500  Critical value read back:Yes.    Nurse who received alert:  Thurmond Butts      Responding MD: Expected value post cath   Time MD responded:

## 2015-08-15 NOTE — ED Provider Notes (Addendum)
CSN: [id number]     Arrival date & time 08/15/15  0051 History  By signing my name below, I, Altamease Oiler, attest that this documentation has been prepared under the direction and in the presence of Sharlett Iles, MD. Electronically Signed: Altamease Oiler, ED Scribe. 08/15/2015. 1:00 AM    Chief Complaint  Patient presents with  . Code STEMI   The history is provided by the patient and the EMS personnel. No language interpreter was used.   Brought in by EMS as a code STEMI, Lance Hancock is a 75 y.o. male who presents to the Emergency Department complaining of constant chest pain with onset at approximately 12:15 AM, less than 45 minutes PTA. He notes that the pain has improved since initial onset. Pt was given 324 mg of aspirin and 4 mg of Zofran by EMS. No NTG was given as he had an initial blood pressure of 84/54 and bradycardia in 50s. Associated symptoms include nausea and mild SOB. He notes that the nausea has improved after Zofran by EMS.  No history of bleeding problems.   LEVEL 5 CAVEAT DUE TO ACUITY OF CONDITION  History reviewed. No pertinent past medical history. Past Surgical History  Procedure Laterality Date  . Eye surgery    . Breast surgery      age 41.   History reviewed. No pertinent family history. Social History  Substance Use Topics  . Smoking status: Former Smoker -- 2.00 packs/day for 18 years    Types: Cigarettes    Quit date: 01/19/1976  . Smokeless tobacco: None  . Alcohol Use: Yes     Comment: occasional    Review of Systems  Unable to perform ROS: Acuity of condition    10 Systems reviewed and all are negative for acute change except as noted in the HPI.   Allergies  Review of patient's allergies indicates no known allergies.  Home Medications   Prior to Admission medications   Medication Sig Start Date End Date Taking? Authorizing Provider  amoxicillin (AMOXIL) 875 MG tablet Take 1 tablet (875 mg total) by mouth 2 (two) times  daily. 02/10/12   Robyn Haber, MD  aspirin 81 MG tablet Take 81 mg by mouth daily.    Historical Provider, MD  azithromycin (ZITHROMAX) 250 MG tablet Take 2 tabs PO x 1 dose, then 1 tab PO QD x 4 days 06/10/12   Darlyne Russian, MD  co-enzyme Q-10 50 MG capsule Take 50 mg by mouth 2 (two) times daily.    Historical Provider, MD  cyclobenzaprine (FLEXERIL) 10 MG tablet Take 1 tablet (10 mg total) by mouth 3 (three) times daily as needed for muscle spasms. 07/08/12   Roselee Culver, MD  fish oil-omega-3 fatty acids 1000 MG capsule Take 2 g by mouth daily. Takes 6000 mg qd    Historical Provider, MD  glucosamine-chondroitin 500-400 MG tablet Take 1 tablet by mouth. Takes 1200 mg qd    Historical Provider, MD  HYDROcodone-acetaminophen (VICODIN) 5-500 MG per tablet Take 1 tablet by mouth every 4 (four) hours as needed for pain. 01/19/12   Posey Boyer, MD  metaxalone [facility]) 800 MG tablet 1/2 to 1 three times daily for muscle relaxant 01/19/12   Posey Boyer, MD  Multiple Vitamin (MULTIVITAMIN) tablet Take 1 tablet by mouth daily. Takes Reliv vitamin And herbs    Historical Provider, MD  oxaprozin (DAYPRO) 600 MG tablet Take one pill twice daily with food for pain and inflammation 03/03/12  Posey Boyer, MD  simvastatin (ZOCOR) 40 MG tablet Take 40 mg by mouth every evening.    Historical Provider, MD   Ht 5' 8.5" (1.74 m)  Wt 214 lb (97.07 kg)  BMI 32.06 kg/m2  SpO2 100% Physical Exam  Constitutional: He is oriented to person, place, and time. He appears well-developed and well-nourished. No distress.  HENT:  Head: Normocephalic and atraumatic.  Moist mucous membranes  Eyes: Conjunctivae are normal. Pupils are equal, round, and reactive to light.  Neck: Neck supple.  Cardiovascular: Regular rhythm and normal heart sounds.  Bradycardia present.   No murmur heard. Pulmonary/Chest: Effort normal and breath sounds normal.  Abdominal: Soft. Bowel sounds are normal. He exhibits no  distension. There is no tenderness.  Musculoskeletal: He exhibits no edema.  Neurological: He is alert and oriented to person, place, and time.  Fluent speech  Skin: Skin is warm. He is diaphoretic.  Psychiatric: He has a normal mood and affect. Judgment normal.  Nursing note and vitals reviewed.   ED Course  .Critical Care Performed by: Sharlett Iles Authorized by: Sharlett Iles Total critical care time: 40 minutes Critical care time was exclusive of separately billable procedures and treating other patients. Critical care was necessary to treat or prevent imminent or life-threatening deterioration of the following conditions: cardiac failure. Critical care was time spent personally by me on the following activities: development of treatment plan with patient or surrogate, discussions with consultants, evaluation of patient's response to treatment, examination of patient, obtaining history from patient or surrogate, ordering and performing treatments and interventions and re-evaluation of patient's condition.   (including critical care time) COORDINATION OF CARE: 12:58 AM Discussed treatment plan which includes lab work, EKG, and IVF with pt at bedside and pt agreed to plan. Cardiology is at the bedside.   Labs Review Labs Reviewed  BASIC METABOLIC PANEL  CBC WITH DIFFERENTIAL/PLATELET  PROTIME-INR  APTT  I-STAT TROPOININ, ED  I-STAT CHEM 8, ED    Imaging Review No results found. I have personally reviewed and evaluated these lab results as part of my medical decision-making.   EKG Interpretation   Date/Time:  Wednesday Aug 15 2015 00:57:58 EDT Ventricular Rate:  51 PR Interval:  243 QRS Duration: 98 QT Interval:  454 QTC Calculation: 418 R Axis:   -29 Text Interpretation:  Sinus rhythm Prolonged PR interval Borderline left  axis deviation Abnormal R-wave progression, early transition Borderline T  wave abnormalities T wave inversions aVL, borderline  depression I No  previous ECGs available Confirmed by LITTLE MD, RACHEL [id number]) on 08/15/2015  1:01:15 AM     Medications  sodium chloride 0.9 % bolus 1,000 mL (1,000 mLs Intravenous New Bag/Given 08/15/15 0059)  heparin injection 5,000 Units (5,000 Units Intravenous Given 08/15/15 0056)    MDM   Final diagnoses:  ST elevation myocardial infarction (STEMI), unspecified artery (HCC)  Bradycardia   Pt brought in by EMS as STEMI alert after he began having CP at 12:15 and was found to have ST elevation in inferior leads w/ T wave inversions in lateral leads. ASA and zofran given in route. On arrival, pt met by cardiology and STEMI protocol initiated w/ above labwork as well as heparin bolus per ACS protocol. He was diaphoretic but in NAD, mentating appropriately. He was bradycardic but w/ stable BP. Placed pacer pads and contacted cath lab. Labwork sent. Pt taken emergently to cath lab for further care.   I personally performed the services described in  this documentation, which was scribed in my presence. The recorded information has been reviewed and is accurate.    Sharlett Iles, MD 08/15/15 0109  Sharlett Iles, MD 08/15/15 [place]

## 2015-08-15 NOTE — Progress Notes (Signed)
CARDIAC REHAB PHASE I   PRE:  Rate/Rhythm: 47 SB    BP: sitting 133/74    SaO2:   MODE:  Ambulation: 290 ft   POST:  Rate/Rhythm: 83 SR    BP: sitting 134/65     SaO2:   Pt up in recliner, feels good. Eager to walk (took slow, easy). No c/o. Pt sts he has historically had a low HR. Began ed, pt very receptive. He has already been watching his diet recently. Will continue ed tomorrow. Understands importance of Brilinta however pt sts he does not have drug coverage. Will discuss with CM. Will send CRPII referral to [facility].  Martin, ACSM 08/15/2015 12:09 PM

## 2015-08-15 NOTE — Progress Notes (Signed)
Upon releasing air from TR band, site started oozing blood. Inserted air back into TR band until site stopped bleeding. Currently 16 CC of air in band. Will try to release more at 0600. Will continue to monitor.

## 2015-08-16 ENCOUNTER — Encounter (HOSPITAL_COMMUNITY): Payer: Self-pay | Admitting: Cardiology

## 2015-08-16 LAB — BASIC METABOLIC PANEL
ANION GAP: 9 (ref 5–15)
BUN: 12 mg/dL (ref 6–20)
CHLORIDE: 105 mmol/L (ref 101–111)
CO2: 25 mmol/L (ref 22–32)
CREATININE: 1 mg/dL (ref 0.61–1.24)
Calcium: 8.8 mg/dL — ABNORMAL LOW (ref 8.9–10.3)
GFR calc non Af Amer: >60 mL/min (ref >60)
Glucose, Bld: 100 mg/dL — ABNORMAL HIGH (ref 65–99)
POTASSIUM: 4.1 mmol/L (ref 3.5–5.1)
SODIUM: 139 mmol/L (ref 135–145)

## 2015-08-16 LAB — CBC
HCT: 42.5 % (ref 39.0–52.0)
HEMOGLOBIN: 14.3 g/dL (ref 13.0–17.0)
MCH: 31.5 pg (ref 26.0–34.0)
MCHC: 33.6 g/dL (ref 30.0–36.0)
MCV: 93.6 fL (ref 78.0–100.0)
PLATELETS: 161 10*3/uL (ref 150–400)
RBC: 4.54 MIL/uL (ref 4.22–5.81)
RDW: 13.6 % (ref 11.5–15.5)
WBC: 8.1 10*3/uL (ref 4.0–10.5)

## 2015-08-16 LAB — HEMOGLOBIN A1C
Hgb A1c MFr Bld: 6.1 % — ABNORMAL HIGH (ref 4.8–5.6)
Mean Plasma Glucose: 128 mg/dL

## 2015-08-16 MED ORDER — NITROGLYCERIN 0.4 MG SL SUBL: 0.4000 mg | SUBLINGUAL_TABLET | SUBLINGUAL | Status: DC | PRN

## 2015-08-16 MED ORDER — OMEGA-3-ACID ETHYL ESTERS 1 G PO CAPS: 2.0000 g | ORAL_CAPSULE | Freq: Every day | ORAL | Status: DC

## 2015-08-16 MED ORDER — ATORVASTATIN CALCIUM 80 MG PO TABS: 80.0000 mg | ORAL_TABLET | Freq: Every day | ORAL | Status: DC

## 2015-08-16 MED ORDER — TICAGRELOR 90 MG PO TABS: 90.0000 mg | ORAL_TABLET | Freq: Two times a day (BID) | ORAL | Status: DC

## 2015-08-16 MED ORDER — METOPROLOL TARTRATE 25 MG PO TABS: 12.5000 mg | ORAL_TABLET | Freq: Two times a day (BID) | ORAL | Status: DC

## 2015-08-16 MED FILL — Tirofiban HCl in NaCl 0.9% IV Soln 5 MG/100ML (Base Equiv): INTRAVENOUS | Qty: 100 | Status: AC

## 2015-08-16 NOTE — Progress Notes (Signed)
Patient Name: Lance Hancock Date of Encounter: 08/16/2015  Active Problems:   ST elevation (STEMI) myocardial infarction involving right coronary artery ([place])   ST elevation myocardial infarction involving right coronary artery [facility])   Primary Cardiologist: Dr. Burt Knack Patient Profile: Lance Hancock is a 75 year old male with history of dyslpidemia brought to the emergency department with an acute onset chest pain. EKG showed inferior ST elevation with reciprocal changes. S/p DESx 2 to RCA.   SUBJECTIVE: feels great, wants to go home.    OBJECTIVE Filed Vitals:   08/15/15 2111 08/15/15 2131 08/16/15 0544 08/16/15 1002  BP: 121/62 121/56 130/78 120/74  Pulse: 53 56  56  Temp: 98.6 F (37 C)  98.4 F (36.9 C)   TempSrc: Oral  Oral   Resp:   16   Height:      Weight:   209 lb 12.8 oz (95.165 kg)   SpO2: 95%  98%     Intake/Output Summary (Last 24 hours) at 08/16/15 1012 Last data filed at 08/16/15 0800  Gross per 24 hour  Intake    720 ml  Output    475 ml  Net    245 ml   Filed Weights   08/15/15 0056 08/15/15 0300 08/16/15 0544  Weight: 214 lb (97.07 kg) 215 lb 2.7 oz (97.6 kg) 209 lb 12.8 oz (95.165 kg)    PHYSICAL EXAM General: Well developed, well nourished, male in no acute distress. Head: Normocephalic, atraumatic.  Neck: Supple without bruits,No JVD. Lungs:  Resp regular and unlabored, CTA. Heart: RRR, S1, S2, no S3, S4, or murmur; no rub. Abdomen: Soft, non-tender, non-distended, BS + x 4.  Extremities: No clubbing, cyanosis, No edema.  Neuro: Alert and oriented X 3. Moves all extremities spontaneously. Psych: Normal affect.  LABS: CBC: Recent Labs  08/15/15 0058  08/15/15 0333 08/16/15 0352  WBC 7.5  --  9.9 8.1  NEUTROABS 3.0  --   --   --   HGB 14.5  < > 15.3 14.3  HCT 42.9  < > 44.1 42.5  MCV 88.8  --  90.6 93.6  PLT 162  --  167 161  < > = values in this interval not displayed. INR: Recent Labs  08/15/15 0058  INR [id number]   Basic  Metabolic Panel: Recent Labs  08/15/15 0912 08/16/15 0352  NA 138 139  K 3.8 4.1  CL 106 105  CO2 24 25  GLUCOSE 111* 100*  BUN 14 12  CREATININE 0.90 1.00  CALCIUM 8.7* 8.8*   Cardiac Enzymes: Recent Labs  08/15/15 0333 08/15/15 0912 08/15/15 1538  TROPONINI 6.42* 17.31* 12.10*    Recent Labs  08/15/15 0117  TROPIPOC 0.02   Hemoglobin A1C: Recent Labs  08/15/15 0333  HGBA1C 6.1*   Fasting Lipid Panel: Recent Labs  08/15/15 0351  CHOL 138  HDL 43  LDLCALC 90  TRIG 26  CHOLHDL 3.2   Thyroid Function Tests: Recent Labs  08/15/15 0351  TSH 1.889     Current facility-administered medications:  .  acetaminophen (TYLENOL) tablet 650 mg, 650 mg, Oral, Q4H PRN, [name], MD .  aspirin chewable tablet 81 mg, 81 mg, Oral, Daily, [name], MD, 81 mg at 08/16/15 1000 .  atorvastatin (LIPITOR) tablet 80 mg, 80 mg, Oral, q1800, [name], MD, 80 mg at 08/15/15 1658 .  Chlorhexidine Gluconate Cloth 2 % PADS 6 each, 6 each, Topical, Q0600, [name], MD, 6 each at 08/16/15 0600 .  cyclobenzaprine (FLEXERIL) tablet 10 mg, 10 mg, Oral, TID PRN, [name], MD .  heparin injection 5,000 Units, 5,000 Units, Subcutaneous, Q8H, [name], MD, 5,000 Units at 08/16/15 0537 .  metoprolol tartrate (LOPRESSOR) tablet 12.5 mg, 12.5 mg, Oral, BID, [name], MD, 12.5 mg at 08/16/15 1002 .  mupirocin ointment (BACTROBAN) 2 % 1 application, 1 application, Nasal, BID, [name], MD, 1 application at [id number] .  nitroGLYCERIN (NITROSTAT) SL tablet 0.4 mg, 0.4 mg, Sublingual, Q5 Min x 3 PRN, [name], MD .  omega-3 acid ethyl esters (LOVAZA) capsule 2 g, 2 g, Oral, Daily, [name], MD, 2 g at 08/16/15 1000 .  ondansetron (ZOFRAN) injection 4 mg, 4 mg, Intravenous, Q6H PRN, [name], MD .  oxyCODONE-acetaminophen (PERCOCET/ROXICET) 5-325 MG per tablet 1-2 tablet, 1-2 tablet, Oral, Q4H PRN, [name], MD .  ticagrelor  [facility]) tablet 90 mg, 90 mg, Oral, BID, [name], MD, 90 mg at 08/16/15 1000    TELE:   Sinus brady  ECG: Sinus brady, t wave inversion in inferolateral leads.   Coronary Stent Intervention    Left Heart Cath and Coronary Angiography    Prox RCA lesion, 99% stenosed. Post intervention, there is a 0% residual stenosis.  Ost LM lesion, 30% stenosed.  There is mild left ventricular systolic dysfunction.  Mid LAD to Dist LAD lesion, 30% stenosed.  Prox Cx to Mid Cx lesion, 25% stenosed.  1. Subtotal occlusion of the RCA with TIMI 2 flow, treated successfully with overlapping drug-eluting stents in the proximal vessel 2. Mild nonobstructive plaque in the left main and LAD 3. Mild segmental LV contraction abnormality with preserved overall LV function  Recommendations: Dual antiplatelet therapy with aspirin and Brilinta for 12 months, post MI medical therapy, if stable the patient is candidate for hospital discharge in 48 hours.   Current Medications:  . aspirin  81 mg Oral Daily  . atorvastatin  80 mg Oral q1800  . Chlorhexidine Gluconate Cloth  6 each Topical Q0600  . heparin  5,000 Units Subcutaneous Q8H  . metoprolol tartrate  12.5 mg Oral BID  . mupirocin ointment  1 application Nasal BID  . omega-3 acid ethyl esters  2 g Oral Daily  . ticagrelor  90 mg Oral BID      ASSESSMENT AND PLAN: Active Problems:   ST elevation (STEMI) myocardial infarction involving right coronary artery (HCC)   ST elevation myocardial infarction involving right coronary artery ([place])  1. STEMI involving RCA: S/p DES x 2 to RCA. Patient is chest pain free, no SOB. Note his angina is mostly jaw and arm pain, which he also denies. He will undergo DAPT with ASA and Brilinta for 12 months. He is on beta blocker, HR is in the upper 50's. Would continue at same dose at discharge. MD to advise if patient is ready for discharge.   2. HLD: patient is on high dose statin and Omega 3 daily.    3. Elevated A1C: A1C is 6.1, patient will need diet modifications and lifestyle modifications for risk reduction. He is agreeable.   Signed, Arbutus Leas , NP 10:12 AM 08/16/2015 Pager [phone]  Patient seen, examined. Available data reviewed. Agree with findings, assessment, and plan as outlined by Jettie Booze, NP. The patient was independently interviewed and examined. His lungs are clear. Heart is regular rate and rhythm. There is no peripheral edema. He is doing very well after an uncomplicated inferior wall STEMI. Medicine recommendations as above. Follow-up  has been arranged. All questions are answered. He is agreeable with the idea of cardiac rehabilitation after he is seen back in early follow-up in 1-2 weeks. Will continue aspirin, Brilinta, low-dose metoprolol, and a high intensity statin.  [name], M.D. 08/16/2015 2:18 PM

## 2015-08-16 NOTE — Progress Notes (Signed)
Patient being discharge per MD order. All discharge instructions given to patient.

## 2015-08-16 NOTE — Care Management Note (Addendum)
Case Management Note  Patient Details  Name: Amias Hutchinson MRN: [id number] Date of Birth: 03/07/1941  Subjective/Objective:  Pt admitted for [facility]. Plan for home on Brilinta. Pt has [place].                   Action/Plan: CM did a benefits check and co pay:Pt has a deductible of $195 after that has been met, copay will be $47- prior auth not required. CM will call pt's pharmacy to make sure medication is available-[facility] [address]- available. No further needs from CM at this time.    Expected Discharge Date:                  Expected Discharge Plan:  Home/Self Care  In-House Referral:     Discharge planning Services  CM Consult, Medication Assistance  Post Acute Care Choice:  NA Choice offered to:  NA  DME Arranged:  N/A DME Agency:  NA  HH Arranged:  NA HH Agency:  NA  Status of Service:  Completed, signed off  Medicare Important Message Given:    Date Medicare IM Given:    Medicare IM give by:    Date Additional Medicare IM Given:    Additional Medicare Important Message give by:     If discussed at [place] of Stay Meetings, dates discussed:    Additional Comments:  Bethena Roys, RN 08/16/2015, 12:27 PM

## 2015-08-16 NOTE — Discharge Summary (Signed)
Discharge Summary    Patient ID: Lance Hancock,  MRN: [id number], DOB/AGE: 75-Dec-1942 75 y.o.  Admit date: 08/15/2015 Discharge date: 08/16/2015  Primary Care Provider: Kennon Portela Primary Cardiologist: Dr. Burt Knack  Discharge Diagnoses    Active Problems:   ST elevation (STEMI) myocardial infarction involving right coronary artery [facility])   ST elevation myocardial infarction involving right coronary artery (HCC)   Allergies Allergies  Allergen Reactions  . Codeine Other (See Comments)  . Lovastatin Other (See Comments)  . Tetracycline Other (See Comments)    Chest pain    Diagnostic Studies/Procedures  Coronary Stent Intervention Left Heart Cath and Coronary Angiography 08/15/15   Prox RCA lesion, 99% stenosed. Post intervention, there is a 0% residual stenosis.  Ost LM lesion, 30% stenosed.  There is mild left ventricular systolic dysfunction.  Mid LAD to Dist LAD lesion, 30% stenosed.  Prox Cx to Mid Cx lesion, 25% stenosed.  1. Subtotal occlusion of the RCA with TIMI 2 flow, treated successfully with overlapping drug-eluting stents in the proximal vessel 2. Mild nonobstructive plaque in the left main and LAD 3. Mild segmental LV contraction abnormality with preserved overall LV function  Recommendations: Dual antiplatelet therapy with aspirin and Brilinta for 12 months, post MI medical therapy, if stable the patient is candidate for hospital discharge in 48 hours.  ____________   History of Present Illness   Lance Hancock is a 75 year old male with a past medical history of dyslipidemia. He developed acute chest pain early in the morning on 08/15/15, pain radiated to his jaw with associated SOB and diaphoresis. Code STEMI was activated as he had inferior ST elevation with reciprocal changes in lateral leads.    Hospital Course  He was taken urgently to the cath lab. He received DES x 2 to his RCA.    He is very active, rides his bike almost every where, up  to 10 miles. He did report that sometimes when he would ride his bike at first he would get jaw pain. Never any chest pain.   His right radial site is stable, no hematoma. His A1C was elevated at 6.1. Patient has verbalized plans for aggressive diet modifications.    He will be on ASA and Brilinta for 12 months. He was started on low dose metoprolol. His heart rate has been in the upper 50's since admission.  _____________  Discharge Vitals Blood pressure 128/65, pulse 50, temperature 98.8 F (37.1 C), temperature source Oral, resp. rate 16, height 5\' 8"  (1.727 m), weight 209 lb 12.8 oz (95.165 kg), SpO2 99 %.  Filed Weights   08/15/15 0056 08/15/15 0300 08/16/15 0544  Weight: 214 lb (97.07 kg) 215 lb 2.7 oz (97.6 kg) 209 lb 12.8 oz (95.165 kg)    Labs & Radiologic Studies     CBC  Recent Labs  08/15/15 0058  08/15/15 0333 08/16/15 0352  WBC 7.5  --  9.9 8.1  NEUTROABS 3.0  --   --   --   HGB 14.5  < > 15.3 14.3  HCT 42.9  < > 44.1 42.5  MCV 88.8  --  90.6 93.6  PLT 162  --  167 161  < > = values in this interval not displayed. Basic Metabolic Panel  Recent Labs  08/15/15 0912 08/16/15 0352  NA 138 139  K 3.8 4.1  CL 106 105  CO2 24 25  GLUCOSE 111* 100*  BUN 14 12  CREATININE 0.90 1.00  CALCIUM 8.7* 8.8*   Liver Function Tests No results for input(s): AST, ALT, ALKPHOS, BILITOT, PROT, ALBUMIN in the last 72 hours. No results for input(s): LIPASE, AMYLASE in the last 72 hours. Cardiac Enzymes  Recent Labs  08/15/15 0333 08/15/15 0912 08/15/15 1538  TROPONINI 6.42* 17.31* 12.10*   Hemoglobin A1C  Recent Labs  08/15/15 0333  HGBA1C 6.1*   Fasting Lipid Panel  Recent Labs  08/15/15 0351  CHOL 138  HDL 43  LDLCALC 90  TRIG 26  CHOLHDL 3.2   Thyroid Function Tests  Recent Labs  08/15/15 0351  TSH 1.889    No results found.  Disposition   Pt is being discharged home today in good condition.  Follow-up Plans & Appointments     Follow-up Information    Follow up with Richardson Dopp, PA-C On 08/27/2015.   Specialties:  Cardiology, Physician Assistant   Why:  at 2:20pm for follow up   Contact information:   [address] [place] [address] [phone]      Discharge Instructions    Amb Referral to Cardiac Rehabilitation    Complete by:  As directed   Diagnosis:   Coronary Stents Comment - to [place] STEMI PTCA       Diet - low sodium heart healthy    Complete by:  As directed      Discharge instructions    Complete by:  As directed   Radial Site Care Refer to this sheet in the next few weeks. These instructions provide you with information on caring for yourself after your procedure. Your caregiver may also give you more specific instructions. Your treatment has been planned according to current medical practices, but problems sometimes occur. Call your caregiver if you have any problems or questions after your procedure. HOME CARE INSTRUCTIONS You may shower the day after the procedure.Remove the bandage (dressing) and gently wash the site with plain soap and water.Gently pat the site dry.  Do not apply powder or lotion to the site.  Do not submerge the affected site in water for 3 to 5 days.  Inspect the site at least twice daily.  Do not flex or bend the affected arm for 24 hours.  No lifting over 5 pounds (2.3 kg) for 5 days after your procedure.  Do not drive home if you are discharged the same day of the procedure. Have someone else drive you.  You may drive 24 hours after the procedure unless otherwise instructed by your caregiver.  What to expect: Any bruising will usually fade within 1 to 2 weeks.  Blood that collects in the tissue (hematoma) may be painful to the touch. It should usually decrease in size and tenderness within 1 to 2 weeks.  SEEK IMMEDIATE MEDICAL CARE IF: You have unusual pain at the radial site.  You have redness, warmth, swelling, or pain at the  radial site.  You have drainage (other than a small amount of blood on the dressing).  You have chills.  You have a fever or persistent symptoms for more than 72 hours.  You have a fever and your symptoms suddenly get worse.  Your arm becomes pale, cool, tingly, or numb.  You have heavy bleeding from the site. Hold pressure on the site.   NO HEAVY LIFTING OR SEXUAL ACTIVITY for 7 DAYS. NO DRIVING for 3-5 DAYS. NO SOAKING BATHS, HOT TUBS, POOLS, ETC., for 7 DAYS     Increase activity slowly  Complete by:  As directed            Discharge Medications   Current Discharge Medication List    START taking these medications   Details  atorvastatin (LIPITOR) 80 MG tablet Take 1 tablet (80 mg total) by mouth daily at 6 PM. Qty: 30 tablet, Refills: 12    metoprolol tartrate (LOPRESSOR) 25 MG tablet Take 0.5 tablets (12.5 mg total) by mouth 2 (two) times daily. Qty: 15 tablet, Refills: 12    nitroGLYCERIN (NITROSTAT) 0.4 MG SL tablet Place 1 tablet (0.4 mg total) under the tongue every 5 (five) minutes x 3 doses as needed for chest pain. Qty: 25 tablet, Refills: 1    omega-3 acid ethyl esters (LOVAZA) 1 g capsule Take 2 capsules (2 g total) by mouth daily. Qty: 30 capsule, Refills: 12    ticagrelor (BRILINTA) 90 MG TABS tablet Take 1 tablet (90 mg total) by mouth 2 (two) times daily. Qty: 60 tablet, Refills: 12      CONTINUE these medications which have NOT CHANGED   Details  aspirin 81 MG tablet Take 81 mg by mouth daily.    co-enzyme Q-10 50 MG capsule Take 100 mg by mouth 2 (two) times daily.     glucosamine-chondroitin 500-400 MG tablet Take 1 tablet by mouth. Takes 1200 mg qd    Multiple Vitamin (MULTIVITAMIN) tablet Take 1 tablet by mouth daily. Takes Reliv vitamin And herbs      STOP taking these medications     fish oil-omega-3 fatty acids 1000 MG capsule      simvastatin (ZOCOR) 40 MG tablet      cyclobenzaprine (FLEXERIL) 10 MG tablet          Aspirin  prescribed at discharge? Yes High Intensity Statin Prescribed? Yes Beta Blocker Prescribed? Yes For EF 45% or less, Was ACEI/ARB Prescribed? No ADP Receptor Inhibitor Prescribed? Yes For EF <40%, Aldosterone Inhibitor Prescribed? No  Was EF assessed during THIS hospitalization? No  Was Cardiac Rehab II ordered? (Included Medically managed Patients): Yes    Outstanding Labs/Studies    Duration of Discharge Encounter   Greater than 30 minutes including physician time.  Signed, Arbutus Leas NP 08/16/2015, 3:44 PM

## 2015-08-21 ENCOUNTER — Telehealth: Payer: Self-pay | Admitting: *Deleted

## 2015-08-21 NOTE — Telephone Encounter (Signed)
I spoke with patient and informed him he did not have the genotype for Dal-Gene study. Patient will not be able to continue in the study. I thanked patient for his willingness to participate in the study.

## 2015-08-27 ENCOUNTER — Encounter: Payer: Self-pay | Admitting: Physician Assistant

## 2015-08-27 ENCOUNTER — Ambulatory Visit (INDEPENDENT_AMBULATORY_CARE_PROVIDER_SITE_OTHER): Payer: Medicare HMO | Admitting: Physician Assistant

## 2015-08-27 VITALS — BP 140/60 | HR 58 | Ht 68.0 in | Wt 209.0 lb

## 2015-08-27 DIAGNOSIS — E785 Hyperlipidemia, unspecified: Secondary | ICD-10-CM | POA: Diagnosis not present

## 2015-08-27 DIAGNOSIS — I2510 Atherosclerotic heart disease of native coronary artery without angina pectoris: Secondary | ICD-10-CM | POA: Diagnosis not present

## 2015-08-27 DIAGNOSIS — I252 Old myocardial infarction: Secondary | ICD-10-CM

## 2015-08-27 NOTE — Patient Instructions (Addendum)
Medication Instructions:  Your physician recommends that you continue on your current medications as directed. Please refer to the Current Medication list given to you today. Labwork: PLEASE HAVE PRIMARY CARE CHECK FASTING LIPID AND LIVER PANEL IN 6-8 WEEKS ; PLEASE HAVE RESULTS FAXED TO [facility], [place] Testing/Procedures: Your physician has requested that you have an echocardiogram. Echocardiography is a painless test that uses sound waves to create images of your heart. It provides your doctor with information about the size and shape of your heart and how well your heart's chambers and valves are working. This procedure takes approximately one hour. There are no restrictions for this procedure. Follow-Up: DR. Burt Knack 2 MONTHS  Any Other Special Instructions Will Be Listed Below (If Applicable). If you need a refill on your cardiac medications before your next appointment, please call your pharmacy.

## 2015-08-27 NOTE — Progress Notes (Signed)
Cardiology Office Note:    Date:  08/27/2015   ID:  Lance Hancock, DOB February 05, 1941, MRN [id number]  PCP:  Fransisca Connors, PA-C  Cardiologist:  Dr. [name]   Electrophysiologist:  n/a  Referring MD: Orma Flaming, MD   Chief Complaint  Patient presents with  . Hospitalization Follow-up    s/p MI >> PCI to RCA    History of Present Illness:     Lance Hancock is a 75 y.o. male with a hx of HL.  Admitted 5/3-5/4 with an inferior STEMI. LHC demonstrated subtotal occlusion the RCA treated with overlapping DES. He had mild monitor to plaque in the left main and LAD. Post PCI course was fairly unremarkable. He returns for follow-up.   Returns for FU.  Here today with his wife.  He has been doing well since DC.  He denies chest discomfort.  He denies dyspnea.  He has had some HAs.  Denies syncope.  Denies orthopnea, PND or significant edema.   He had many questions today regarding his diagnosis, treatment of cholesterol and dietary supplements that he is taking.  He also had questions regarding activity level and exercise.     Past Medical History  Diagnosis Date  . ST elevation (STEMI) myocardial infarction involving right coronary artery ([place]) 08/15/2015  . CAD (coronary artery disease)     a. STEMI 08/2015 3.5x58mm Promus DES to prox RCA, 3.0x71mm Promus DES to prox RCA.     Past Surgical History  Procedure Laterality Date  . Eye surgery    . Breast surgery      age 11.  . Cardiac catheterization N/A 08/15/2015    Procedure: Left Heart Cath and Coronary Angiography;  Surgeon: [name], MD;  Location: [facility];  Service: Cardiovascular;  Laterality: N/A;  . Cardiac catheterization N/A 08/15/2015    Procedure: Coronary Stent Intervention;  Surgeon: [name], MD;  Location: [facility];  Service: Cardiovascular;  Laterality: N/A;  Proximal RCA  (3.5/16 and 3.0/20 Promus)    Current Medications: Outpatient Prescriptions Prior to Visit  Medication Sig  Dispense Refill  . aspirin 81 MG tablet Take 81 mg by mouth daily.    Marland Kitchen atorvastatin (LIPITOR) 80 MG tablet Take 1 tablet (80 mg total) by mouth daily at 6 PM. 30 tablet 12  . co-enzyme Q-10 50 MG capsule Take 100 mg by mouth 2 (two) times daily.     Marland Kitchen glucosamine-chondroitin 500-400 MG tablet Take 1 tablet by mouth. Takes 1200 mg qd    . metoprolol tartrate (LOPRESSOR) 25 MG tablet Take 0.5 tablets (12.5 mg total) by mouth 2 (two) times daily. 15 tablet 12  . Multiple Vitamin (MULTIVITAMIN) tablet Take 1 tablet by mouth daily. Takes Reliv vitamin And herbs    . nitroGLYCERIN (NITROSTAT) 0.4 MG SL tablet Place 1 tablet (0.4 mg total) under the tongue every 5 (five) minutes x 3 doses as needed for chest pain. 25 tablet 1  . omega-3 acid ethyl esters (LOVAZA) 1 g capsule Take 2 capsules (2 g total) by mouth daily. 30 capsule 12  . ticagrelor (BRILINTA) 90 MG TABS tablet Take 1 tablet (90 mg total) by mouth 2 (two) times daily. 60 tablet 12   No facility-administered medications prior to visit.      Allergies:   Codeine; Lovastatin; and Tetracycline   Social History   Social History  . Marital Status: Married    Spouse Name: N/A  . Number of Children: N/A  .  Years of Education: N/A   Social History Main Topics  . Smoking status: Former Smoker -- 2.00 packs/day for 18 years    Types: Cigarettes    Quit date: 01/19/1976  . Smokeless tobacco: None  . Alcohol Use: Yes     Comment: occasional  . Drug Use: No  . Sexual Activity:    Partners: Female   Other Topics Concern  . None   Social History Narrative   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     Family History:  The patient's family history includes Heart disease in his sister; Hyperlipidemia in his mother; Hypertension in his mother.   ROS:   Please see the history of present illness.    ROS All other systems reviewed and are negative.   Physical Exam:    VS:  BP  140/60 mmHg  Pulse 58  Ht 5\' 8"  (1.727 m)  Wt 209 lb (94.802 kg)  BMI 31.79 kg/m2   GEN: Well nourished, well developed, in no acute distress HEENT: normal Neck: no JVD, no masses Cardiac: Normal 99991111, RRR; 1/6 systolic murmur LLSB, no rubs, or gallops, no edema; right wrist without hematoma or mass    Respiratory:  clear to auscultation bilaterally; no wheezing, rhonchi or rales GI: soft, nontender, nondistended MS: no deformity or atrophy Skin: warm and dry Neuro: No focal deficits  Psych: Alert and oriented x 3, normal affect  Wt Readings from Last 3 Encounters:  08/27/15 209 lb (94.802 kg)  08/16/15 209 lb 12.8 oz (95.165 kg)  07/08/12 218 lb (98.884 kg)      Studies/Labs Reviewed:     EKG:  EKG is  ordered today.  The ekg ordered today demonstrates Sinus bradycardia, HR 58, LAD, inferior T-wave inversions  Recent Labs: 08/15/2015: TSH 1.889 08/16/2015: BUN 12; Creatinine, Ser 1.00; Hemoglobin 14.3; Platelets 161; Potassium 4.1; Sodium 139   Recent Lipid Panel    Component Value Date/Time   CHOL 138 08/15/2015 0351   TRIG 26 08/15/2015 0351   HDL 43 08/15/2015 0351   CHOLHDL 3.2 08/15/2015 0351   VLDL 5 08/15/2015 0351   LDLCALC 90 08/15/2015 0351    Additional studies/ records that were reviewed today include:   LHC 08/15/15 LM ostial 30% LAD mid 30% LCx proximal 25% RCA proximal 99% EF 55-60%, mild inferior wall hypokinesis PCI: 3 x 20 mm Promus premier DES, 3.5 x 16 mm Promus premier DES overlapping to the proximal RCA   ASSESSMENT:     1. History of acute inferior wall MI   2. Coronary artery disease involving native coronary artery of native heart without angina pectoris   3. Hyperlipidemia     PLAN:     In order of problems listed above:  1. Hx of Inferior MI - EF was normal at cath.  He has a soft murmur on exam that is probably benign.  He is not on an ACE inhibitor.  His BP usually tends to run low.    -  Get FU Echo.  If EF down, will need to  consider starting low dose ACE inhibitor if he is agreeable to start.   2. CAD - s/p Inferior STEMI tx with overlapping DES to RCA.  EF preserved at [facility].  We discussed the importance of dual antiplatelet therapy.    -  Continue ASA, Brilinta, beta-blocker, statin.  -  Refer to Cardiac Rehab.  -  ACE inhibitor is  deferred as noted above.  Get Echo.  3. HL - He reports a long hx of high Trigs (in the "1200" range).  Trigs in the hospital looked good at 69.  Continue Lipitor 80.  Get FU Lipids and LFTs with his PCP in 6-8 weeks.  If Trigs > 400, will need to start Fenofibrate.  He could FU with our Lipid clinic if Lipids are hard to manage.     Medication Adjustments/Labs and Tests Ordered: Current medicines are reviewed at length with the patient today.  Concerns regarding medicines are outlined above.  Medication changes, Labs and Tests ordered today are outlined in the Patient Instructions noted below. Patient Instructions  Medication Instructions:  Your physician recommends that you continue on your current medications as directed. Please refer to the Current Medication list given to you today. Labwork: PLEASE HAVE PRIMARY CARE CHECK FASTING LIPID AND LIVER PANEL IN 6-8 WEEKS ; PLEASE HAVE RESULTS FAXED TO [place], [place] Testing/Procedures: Your physician has requested that you have an echocardiogram. Echocardiography is a painless test that uses sound waves to create images of your heart. It provides your doctor with information about the size and shape of your heart and how well your heart's chambers and valves are working. This procedure takes approximately one hour. There are no restrictions for this procedure. Follow-Up: DR. Burt Hancock 2 MONTHS  Any Other Special Instructions Will Be Listed Below (If Applicable). If you need a refill on your cardiac medications before your next appointment, please call your pharmacy.   Signed, Lance Dopp, PA-C  08/27/2015 3:41 PM      [facility] [address], [place], [place]  [address] Phone: [phone]; Fax: [phone]

## 2015-09-03 ENCOUNTER — Telehealth: Payer: Self-pay | Admitting: Cardiovascular Disease

## 2015-09-03 NOTE — Telephone Encounter (Signed)
[place]  Pt requested to speak w/ RN- concerning upcoming echocardiogram- and followup thereafter. Please call back and discuss.

## 2015-09-03 NOTE — Telephone Encounter (Signed)
I spoke with the pt and scheduled him for 2 month follow-up with Dr Burt Knack on 10/15/15. The pt also asked about starting cardiac rehabilitation.  A referral has been placed in the system but I will contact cardiac rehab to obtain an update for this pt. I left a message with cardiac rehab to contact the pt to begin program.

## 2015-09-11 ENCOUNTER — Other Ambulatory Visit: Payer: Self-pay

## 2015-09-11 ENCOUNTER — Encounter: Payer: Self-pay | Admitting: Physician Assistant

## 2015-09-11 ENCOUNTER — Telehealth: Payer: Self-pay | Admitting: *Deleted

## 2015-09-11 ENCOUNTER — Ambulatory Visit (HOSPITAL_COMMUNITY): Payer: Medicare HMO | Attending: Cardiovascular Disease

## 2015-09-11 DIAGNOSIS — I2510 Atherosclerotic heart disease of native coronary artery without angina pectoris: Secondary | ICD-10-CM | POA: Insufficient documentation

## 2015-09-11 DIAGNOSIS — Z87891 Personal history of nicotine dependence: Secondary | ICD-10-CM | POA: Diagnosis not present

## 2015-09-11 DIAGNOSIS — I517 Cardiomegaly: Secondary | ICD-10-CM | POA: Insufficient documentation

## 2015-09-11 DIAGNOSIS — I252 Old myocardial infarction: Secondary | ICD-10-CM | POA: Insufficient documentation

## 2015-09-11 DIAGNOSIS — E785 Hyperlipidemia, unspecified: Secondary | ICD-10-CM | POA: Insufficient documentation

## 2015-09-11 DIAGNOSIS — I059 Rheumatic mitral valve disease, unspecified: Secondary | ICD-10-CM | POA: Insufficient documentation

## 2015-09-11 NOTE — Telephone Encounter (Signed)
Pt notified of echo results by phone with verbal understanding. I will fax results to PCP as well.

## 2015-09-26 ENCOUNTER — Other Ambulatory Visit: Payer: Self-pay | Admitting: Cardiovascular Disease

## 2015-09-26 MED ORDER — OMEGA-3-ACID ETHYL ESTERS 1 G PO CAPS: 2.0000 g | ORAL_CAPSULE | Freq: Every day | ORAL | Status: DC

## 2015-10-02 ENCOUNTER — Encounter (HOSPITAL_COMMUNITY)
Admission: RE | Admit: 2015-10-02 | Discharge: 2015-10-02 | Disposition: A | Discharge status: Home / Self Care | Payer: Medicare HMO | Source: Ambulatory Visit | Attending: Cardiovascular Disease | Admitting: Cardiovascular Disease

## 2015-10-02 ENCOUNTER — Encounter (HOSPITAL_COMMUNITY): Payer: Self-pay

## 2015-10-02 VITALS — BP 122/60 | HR 61 | Ht 67.75 in | Wt 201.9 lb

## 2015-10-02 DIAGNOSIS — Z955 Presence of coronary angioplasty implant and graft: Secondary | ICD-10-CM | POA: Diagnosis present

## 2015-10-02 DIAGNOSIS — Z7982 Long term (current) use of aspirin: Secondary | ICD-10-CM | POA: Diagnosis not present

## 2015-10-02 DIAGNOSIS — Z87891 Personal history of nicotine dependence: Secondary | ICD-10-CM | POA: Insufficient documentation

## 2015-10-02 DIAGNOSIS — Z79899 Other long term (current) drug therapy: Secondary | ICD-10-CM | POA: Diagnosis not present

## 2015-10-02 DIAGNOSIS — I213 ST elevation (STEMI) myocardial infarction of unspecified site: Secondary | ICD-10-CM | POA: Insufficient documentation

## 2015-10-02 NOTE — Progress Notes (Signed)
Cardiac Rehab Medication Review by a Pharmacist  Does the patient feel that his/her medications are working for him/her?  yes  Has the patient been experiencing any side effects to the medications prescribed?  no  Does the patient measure his/her own blood pressure or blood glucose at home?  no   Does the patient have any problems obtaining medications due to transportation or finances?   Brilinta is expensive, but patient is able to obtain. He will discuss changes with his cardiologist at his next appointment.  Understanding of regimen: good Understanding of indications: good Potential of compliance: excellent  Pharmacist comments: Mr Costigan is a 75 yo M who is doing well with his medications. He discussed the expense of Brilinta and that he is interested in finding a less expensive alternative. I recommended he discuss this with his cardiologist at his next appointment.   Dimitri Ped, PharmD. PGY-1 Pharmacy Resident Pager: [phone] 10/02/2015 2:45 PM

## 2015-10-05 NOTE — Progress Notes (Signed)
Cardiac Individual Treatment Plan  Patient Details  Name: Lance Hancock MRN: [id number] Date of Birth: [birth year] Referring Provider:        CARDIAC REHAB PHASE II ORIENTATION from 10/02/2015 in [place]   Referring Provider  [name] MD      Initial Encounter Date:       CARDIAC REHAB PHASE II ORIENTATION from 10/02/2015 in [place]   Date  10/02/15   Referring Provider  [name] MD      Visit Diagnosis: ST elevation myocardial infarction (STEMI), unspecified artery ([place])  Stented coronary artery  Patient's Home Medications on Admission:  Current outpatient prescriptions:  .  aspirin 81 MG tablet, Take 81 mg by mouth daily., Disp: , Rfl:  .  atorvastatin (LIPITOR) 80 MG tablet, Take 1 tablet (80 mg total) by mouth daily at 6 PM., Disp: 30 tablet, Rfl: 12 .  co-enzyme Q-10 50 MG capsule, Take 100 mg by mouth 2 (two) times daily. , Disp: , Rfl:  .  glucosamine-chondroitin 500-400 MG tablet, Take 1 tablet by mouth. Takes 1200 mg qd, Disp: , Rfl:  .  metoprolol tartrate (LOPRESSOR) 25 MG tablet, Take 0.5 tablets (12.5 mg total) by mouth 2 (two) times daily., Disp: 15 tablet, Rfl: 12 .  Multiple Vitamin (MULTIVITAMIN) tablet, Take 1 tablet by mouth daily. Takes Reliv vitamin And herbs, Disp: , Rfl:  .  nitroGLYCERIN (NITROSTAT) 0.4 MG SL tablet, Place 1 tablet (0.4 mg total) under the tongue every 5 (five) minutes x 3 doses as needed for chest pain., Disp: 25 tablet, Rfl: 1 .  omega-3 acid ethyl esters (LOVAZA) 1 g capsule, Take 2 capsules (2 g total) by mouth daily., Disp: 60 capsule, Rfl: 10 .  ticagrelor (BRILINTA) 90 MG TABS tablet, Take 1 tablet (90 mg total) by mouth 2 (two) times daily., Disp: 60 tablet, Rfl: 12  Past Medical History: Past Medical History  Diagnosis Date  . ST elevation (STEMI) myocardial infarction involving right coronary artery ([place]) 08/15/2015  . CAD (coronary artery disease)      a. STEMI 08/2015 3.5x36mm Promus DES to prox RCA, 3.0x39mm Promus DES to prox RCA.   Marland Kitchen History of echocardiogram     a. Echo 5/17: EF 55-60%, normal wall motion, normal diastolic function, MAC, moderate LAE    Tobacco Use: History  Smoking status  . Former Smoker -- 2.00 packs/day for 18 years  . Types: Cigarettes  . Quit date: 01/19/1976  Smokeless tobacco  . Not on file    Labs: Recent Review Flowsheet Data    Labs for ITP Cardiac and Pulmonary Rehab Latest Ref Rng 08/15/2015   Cholestrol 0 - 200 mg/dL 138   LDLCALC 0 - 99 mg/dL 90   HDL >40 mg/dL 43   Trlycerides <150 mg/dL 26   Hemoglobin A1c 4.8 - 5.6 % 6.1(H)   TCO2 0 - 100 mmol/L 20      Capillary Blood Glucose: No results found for: GLUCAP   Exercise Target Goals: Date: 10/02/15  Exercise Program Goal: Individual exercise prescription set with THRR, safety & activity barriers. Participant demonstrates ability to understand and report RPE using BORG scale, to self-measure pulse accurately, and to acknowledge the importance of the exercise prescription.  Exercise Prescription Goal: Starting with aerobic activity 30 plus minutes a day, 3 days per week for initial exercise prescription. Provide home exercise prescription and guidelines that participant acknowledges understanding prior to discharge.  Activity Barriers &  Risk Stratification:     Activity Barriers & Cardiac Risk Stratification - 10/02/15 1409    Activity Barriers & Cardiac Risk Stratification   Activity Barriers None   Cardiac Risk Stratification High      6 Minute Walk:     6 Minute Walk      10/02/15 1354       6 Minute Walk   Phase Initial     Distance 1765 feet     Walk Time 6 minutes     # of Rest Breaks 0     MPH 3.34     METS 3.5     RPE 14     VO2 Peak 12.24     Symptoms Yes (comment)     Comments Leg fatigue, relieved with rest.     Resting HR 61 bpm     Resting BP 122/60 mmHg     Max Ex. HR 97 bpm     Max Ex. BP  162/72 mmHg     2 Minute Post BP 128/70 mmHg        Initial Exercise Prescription:     Initial Exercise Prescription - 10/03/15 0800    Date of Initial Exercise RX and Referring Provider   Date 10/02/15   Referring Provider [name] MD   Treadmill   MPH 2.3   Grade 1   Minutes 10   METs 3.08   Bike   Level 1.2   Minutes 10   METs 3.5   NuStep   Level 3   Minutes 10   METs 2.5   Prescription Details   Frequency (times per week) 3   Duration Progress to 30 minutes of continuous aerobic without signs/symptoms of physical distress   Intensity   THRR 40-80% of Max Heartrate 58-117   Ratings of Perceived Exertion 11-13   Perceived Dyspnea 0-4   Progression   Progression Continue to progress workloads to maintain intensity without signs/symptoms of physical distress.   Resistance Training   Training Prescription Yes   Weight 2 lbs.   Reps 10-12      Perform Capillary Blood Glucose checks as needed.  Exercise Prescription Changes:   Exercise Comments:   Discharge Exercise Prescription (Final Exercise Prescription Changes):   Nutrition:  Target Goals: Understanding of nutrition guidelines, daily intake of sodium 1500mg , cholesterol 200mg , calories 30% from fat and 7% or less from saturated fats, daily to have 5 or more servings of fruits and vegetables.  Biometrics:     Pre Biometrics - 10/02/15 1354    Pre Biometrics   Height 5' 7.75" (1.721 m)   Weight 201 lb 15.1 oz (91.6 kg)   Waist Circumference 40 inches   Hip Circumference 42.75 inches   Waist to Hip Ratio 0.94 %   BMI (Calculated) 31   Triceps Skinfold 18 mm   % Body Fat 29.7 %   Grip Strength 40.5 kg   Flexibility 17 in   Single Leg Stand 5.75 seconds       Nutrition Therapy Plan and Nutrition Goals:   Nutrition Discharge: Nutrition Scores:   Nutrition Goals Re-Evaluation:   Psychosocial: Target Goals: Acknowledge presence or absence of depression, maximize coping skills,  provide positive support system. Participant is able to verbalize types and ability to use techniques and skills needed for reducing stress and depression.  Initial Review & Psychosocial Screening:     Initial Psych Review & Screening - 10/02/15 1606    Family Dynamics   Good  Support System? Yes   Barriers   Psychosocial barriers to participate in program The patient should benefit from training in stress management and relaxation.   Screening Interventions   Interventions Encouraged to exercise      Quality of Life Scores:     Quality of Life - 10/02/15 1354    Quality of Life Scores   Health/Function Pre 27.03 %   Socioeconomic Pre 25.57 %   Psych/Spiritual Pre 28.29 %   Family Pre 27.6 %   GLOBAL Pre 27.07 %      PHQ-9:     Recent Review Flowsheet Data    There is no flowsheet data to display.      Psychosocial Evaluation and Intervention:   Psychosocial Re-Evaluation:   Vocational Rehabilitation: Provide vocational rehab assistance to qualifying candidates.   Vocational Rehab Evaluation & Intervention:     Vocational Rehab - 10/02/15 1604    Initial Vocational Rehab Evaluation & Intervention   Assessment shows need for Vocational Rehabilitation No      Education: Education Goals: Education classes will be provided on a weekly basis, covering required topics. Participant will state understanding/return demonstration of topics presented.  Learning Barriers/Preferences:     Learning Barriers/Preferences - 10/02/15 1604    Learning Barriers/Preferences   Learning Barriers Sight   Learning Preferences Audio;Verbal Instruction;Video;Skilled Demonstration;Written Material      Education Topics: Count Your Pulse:  -Group instruction provided by verbal instruction, demonstration, patient participation and written materials to support subject.  Instructors address importance of being able to find your pulse and how to count your pulse when at home  without a heart monitor.  Patients get hands on experience counting their pulse with staff help and individually.   Heart Attack, Angina, and Risk Factor Modification:  -Group instruction provided by verbal instruction, video, and written materials to support subject.  Instructors address signs and symptoms of angina and heart attacks.    Also discuss risk factors for heart disease and how to make changes to improve heart health risk factors.   Functional Fitness:  -Group instruction provided by verbal instruction, demonstration, patient participation, and written materials to support subject.  Instructors address safety measures for doing things around the house.  Discuss how to get up and down off the floor, how to pick things up properly, how to safely get out of a chair without assistance, and balance training.   Meditation and Mindfulness:  -Group instruction provided by verbal instruction, patient participation, and written materials to support subject.  Instructor addresses importance of mindfulness and meditation practice to help reduce stress and improve awareness.  Instructor also leads participants through a meditation exercise.    Stretching for Flexibility and Mobility:  -Group instruction provided by verbal instruction, patient participation, and written materials to support subject.  Instructors lead participants through series of stretches that are designed to increase flexibility thus improving mobility.  These stretches are additional exercise for major muscle groups that are typically performed during regular warm up and cool down.   Hands Only CPR Anytime:  -Group instruction provided by verbal instruction, video, patient participation and written materials to support subject.  Instructors co-teach with AHA video for hands only CPR.  Participants get hands on experience with mannequins.   Nutrition I class: Heart Healthy Eating:  -Group instruction provided by PowerPoint  slides, verbal discussion, and written materials to support subject matter. The instructor gives an explanation and review of the Therapeutic Lifestyle Changes diet recommendations, which includes a  discussion on lipid goals, dietary fat, sodium, fiber, plant stanol/sterol esters, sugar, and the components of a well-balanced, healthy diet.   Nutrition II class: Lifestyle Skills:  -Group instruction provided by PowerPoint slides, verbal discussion, and written materials to support subject matter. The instructor gives an explanation and review of label reading, grocery shopping for heart health, heart healthy recipe modifications, and ways to make healthier choices when eating out.   Diabetes Question & Answer:  -Group instruction provided by PowerPoint slides, verbal discussion, and written materials to support subject matter. The instructor gives an explanation and review of diabetes co-morbidities, pre- and post-prandial blood glucose goals, pre-exercise blood glucose goals, signs, symptoms, and treatment of hypoglycemia and hyperglycemia, and foot care basics.   Diabetes Blitz:  -Group instruction provided by PowerPoint slides, verbal discussion, and written materials to support subject matter. The instructor gives an explanation and review of the physiology behind type 1 and type 2 diabetes, diabetes medications and rational behind using different medications, pre- and post-prandial blood glucose recommendations and Hemoglobin A1c goals, diabetes diet, and exercise including blood glucose guidelines for exercising safely.    Portion Distortion:  -Group instruction provided by PowerPoint slides, verbal discussion, written materials, and food models to support subject matter. The instructor gives an explanation of serving size versus portion size, changes in portions sizes over the last 20 years, and what consists of a serving from each food group.   Stress Management:  -Group instruction  provided by verbal instruction, video, and written materials to support subject matter.  Instructors review role of stress in heart disease and how to cope with stress positively.     Exercising on Your Own:  -Group instruction provided by verbal instruction, power point, and written materials to support subject.  Instructors discuss benefits of exercise, components of exercise, frequency and intensity of exercise, and end points for exercise.  Also discuss use of nitroglycerin and activating EMS.  Review options of places to exercise outside of rehab.  Review guidelines for sex with heart disease.   Cardiac Drugs I:  -Group instruction provided by verbal instruction and written materials to support subject.  Instructor reviews cardiac drug classes: antiplatelets, anticoagulants, beta blockers, and statins.  Instructor discusses reasons, side effects, and lifestyle considerations for each drug class.   Cardiac Drugs II:  -Group instruction provided by verbal instruction and written materials to support subject.  Instructor reviews cardiac drug classes: angiotensin converting enzyme inhibitors (ACE-I), angiotensin II receptor blockers (ARBs), nitrates, and calcium channel blockers.  Instructor discusses reasons, side effects, and lifestyle considerations for each drug class.   Anatomy and Physiology of the Circulatory System:  -Group instruction provided by verbal instruction, video, and written materials to support subject.  Reviews functional anatomy of heart, how it relates to various diagnoses, and what role the heart plays in the overall system.   Knowledge Questionnaire Score:     Knowledge Questionnaire Score - 10/02/15 1354    Knowledge Questionnaire Score   Pre Score 22/24      Core Components/Risk Factors/Patient Goals at Admission:     Personal Goals and Risk Factors at Admission - 10/02/15 1604    Core Components/Risk Factors/Patient Goals on Admission    Weight Management  Yes;Weight Loss   Intervention Weight Management: Provide education and appropriate resources to help participant work on and attain dietary goals.;Weight Management: Develop a combined nutrition and exercise program designed to reach desired caloric intake, while maintaining appropriate intake of nutrient and fiber, sodium and  fats, and appropriate energy expenditure required for the weight goal.   Admit Weight 201 lb 15.1 oz (91.6 kg)   Expected Outcomes Short Term: Continue to assess and modify interventions until short term weight is achieved;Long Term: Adherence to nutrition and physical activity/exercise program aimed toward attainment of established weight goal;Weight Loss: Understanding of general recommendations for a balanced deficit meal plan, which promotes 1-2 lb weight loss per week and includes a negative energy balance of [phone] kcal/d   Increase Strength and Stamina Yes   Intervention Provide advice, education, support and counseling about physical activity/exercise needs.;Develop an individualized exercise prescription for aerobic and resistive training based on initial evaluation findings, risk stratification, comorbidities and participant's personal goals.   Expected Outcomes Achievement of increased cardiorespiratory fitness and enhanced flexibility, muscular endurance and strength shown through measurements of functional capacity and personal statement of participant.   Lipids Yes   Intervention Provide education and support for participant on nutrition & aerobic/resistive exercise along with prescribed medications to achieve LDL 70mg , HDL >40mg .   Expected Outcomes Short Term: Participant states understanding of desired cholesterol values and is compliant with medications prescribed. Participant is following exercise prescription and nutrition guidelines.;Long Term: Cholesterol controlled with medications as prescribed, with individualized exercise RX and with personalized  nutrition plan. Value goals: LDL < 70mg , HDL > 40 mg.      Core Components/Risk Factors/Patient Goals Review:    Core Components/Risk Factors/Patient Goals at Discharge (Final Review):    ITP Comments:     ITP Comments      10/02/15 1602           ITP Comments Dr. Fransico Him, Medical Director           Comments: Patient attended orientation from 1330  to 1530 to review rules and guidelines for program. Completed 6 minute walk test, Intitial ITP, and exercise prescription.  VSS. Telemetry-sinus .  Asymptomatic.

## 2015-10-08 ENCOUNTER — Ambulatory Visit (HOSPITAL_COMMUNITY): Payer: Medicare HMO

## 2015-10-08 ENCOUNTER — Encounter (HOSPITAL_COMMUNITY)
Admission: RE | Admit: 2015-10-08 | Discharge: 2015-10-08 | Disposition: A | Discharge status: Home / Self Care | Payer: Medicare HMO | Source: Ambulatory Visit | Attending: Cardiovascular Disease | Admitting: Cardiovascular Disease

## 2015-10-08 ENCOUNTER — Encounter (HOSPITAL_COMMUNITY)
Admission: RE | Admit: 2015-10-08 | Discharge: 2015-10-08 | Disposition: A | Discharge status: Home / Self Care | Payer: Medicare HMO | Source: Ambulatory Visit

## 2015-10-08 DIAGNOSIS — I213 ST elevation (STEMI) myocardial infarction of unspecified site: Secondary | ICD-10-CM

## 2015-10-08 DIAGNOSIS — Z955 Presence of coronary angioplasty implant and graft: Secondary | ICD-10-CM

## 2015-10-08 NOTE — Progress Notes (Signed)
Daily Session Note  Patient Details  Name: Lance Hancock MRN: [id number] Date of Birth: Oct 26, 1940 Referring Provider:        CARDIAC REHAB PHASE II ORIENTATION from 10/02/2015 in [place]   Referring Provider  [name] MD      Encounter Date: 10/08/2015  Check In:     Session Check In - 10/08/15 0839    Check-In   Location [facility]   Staff Present Andi Hence, RN, Marga Melnick, RN, Deland Pretty, MS, ACSM CEP, Exercise Physiologist;Amber Fair, MS, ACSM RCEP, Exercise Physiologist   Supervising physician immediately available to respond to emergencies [facility] immediately available   Physician(s) Dr. Marily Memos    Medication changes reported     No   Fall or balance concerns reported    No   Warm-up and Cool-down Performed as group-led instruction   Resistance Training Performed Yes   VAD Patient? No   Pain Assessment   Currently in Pain? No/denies   Multiple Pain Sites No      Capillary Blood Glucose: No results found for this or any previous visit (from the past 24 hour(s)).   Goals Met:  Exercise tolerated well  Goals Unmet:  Not Applicable  Comments: Pt started cardiac rehab today.  Pt tolerated light exercise without difficulty. VSS, telemetry-sinus rhythm, asymptomatic.  Medication list reconciled. Pt denies barriers to medicaiton compliance.  PSYCHOSOCIAL ASSESSMENT:  PHQ-0. Pt exhibits positive coping skills, hopeful outlook with supportive family. No psychosocial needs identified at this time, no psychosocial interventions necessary.    Pt enjoys travel, shopping and taking tours, bicycyling, flea markets, and "anything to stay moving".  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  He is eager to return to this position.  Pt cardiac rehab goals are to return to normal routine.   Pt oriented to exercise equipment and routine.    Understanding verbalized.   Dr. Fransico Him is Medical  Director for Cardiac Rehab at [facility].

## 2015-10-10 ENCOUNTER — Encounter (HOSPITAL_COMMUNITY)
Admission: RE | Admit: 2015-10-10 | Discharge: 2015-10-10 | Disposition: A | Discharge status: Home / Self Care | Payer: Medicare HMO | Source: Ambulatory Visit | Attending: Cardiovascular Disease | Admitting: Cardiovascular Disease

## 2015-10-10 ENCOUNTER — Ambulatory Visit (HOSPITAL_COMMUNITY): Payer: Medicare HMO

## 2015-10-10 DIAGNOSIS — I213 ST elevation (STEMI) myocardial infarction of unspecified site: Secondary | ICD-10-CM | POA: Diagnosis not present

## 2015-10-10 DIAGNOSIS — Z955 Presence of coronary angioplasty implant and graft: Secondary | ICD-10-CM

## 2015-10-11 ENCOUNTER — Encounter: Payer: Self-pay | Admitting: Physician Assistant

## 2015-10-12 ENCOUNTER — Ambulatory Visit (HOSPITAL_COMMUNITY): Payer: Medicare HMO

## 2015-10-12 ENCOUNTER — Encounter (HOSPITAL_COMMUNITY)
Admission: RE | Admit: 2015-10-12 | Discharge: 2015-10-12 | Disposition: A | Discharge status: Home / Self Care | Payer: Medicare HMO | Source: Ambulatory Visit | Attending: Cardiovascular Disease | Admitting: Cardiovascular Disease

## 2015-10-12 DIAGNOSIS — Z955 Presence of coronary angioplasty implant and graft: Secondary | ICD-10-CM

## 2015-10-12 DIAGNOSIS — I213 ST elevation (STEMI) myocardial infarction of unspecified site: Secondary | ICD-10-CM

## 2015-10-15 ENCOUNTER — Telehealth: Payer: Self-pay | Admitting: *Deleted

## 2015-10-15 ENCOUNTER — Encounter: Payer: Self-pay | Admitting: Cardiovascular Disease

## 2015-10-15 ENCOUNTER — Ambulatory Visit (HOSPITAL_COMMUNITY): Payer: Medicare HMO

## 2015-10-15 ENCOUNTER — Ambulatory Visit (INDEPENDENT_AMBULATORY_CARE_PROVIDER_SITE_OTHER): Payer: Medicare HMO | Admitting: Cardiovascular Disease

## 2015-10-15 ENCOUNTER — Encounter (HOSPITAL_COMMUNITY)
Admission: RE | Admit: 2015-10-15 | Discharge: 2015-10-15 | Disposition: A | Discharge status: Home / Self Care | Payer: Medicare HMO | Source: Ambulatory Visit | Attending: Cardiovascular Disease | Admitting: Cardiovascular Disease

## 2015-10-15 VITALS — BP 132/70 | HR 64 | Ht 67.75 in | Wt 200.8 lb

## 2015-10-15 DIAGNOSIS — Z87891 Personal history of nicotine dependence: Secondary | ICD-10-CM | POA: Diagnosis not present

## 2015-10-15 DIAGNOSIS — Z79899 Other long term (current) drug therapy: Secondary | ICD-10-CM | POA: Diagnosis not present

## 2015-10-15 DIAGNOSIS — I2510 Atherosclerotic heart disease of native coronary artery without angina pectoris: Secondary | ICD-10-CM

## 2015-10-15 DIAGNOSIS — Z955 Presence of coronary angioplasty implant and graft: Secondary | ICD-10-CM | POA: Insufficient documentation

## 2015-10-15 DIAGNOSIS — I213 ST elevation (STEMI) myocardial infarction of unspecified site: Secondary | ICD-10-CM | POA: Diagnosis present

## 2015-10-15 DIAGNOSIS — Z7982 Long term (current) use of aspirin: Secondary | ICD-10-CM | POA: Diagnosis not present

## 2015-10-15 NOTE — Patient Instructions (Addendum)
Medication Instructions:  Your physician has recommended you make the following change in your medication:  1. STOP Lovaza 2. RESTART Fish oil at previous dosage  Labwork: No new orders.   Testing/Procedures: No new orders.   Follow-Up: Your physician wants you to follow-up in: 6 MONTHS with Dr Burt Knack.  You will receive a reminder letter in the mail two months in advance. If you don't receive a letter, please call our office to schedule the follow-up appointment.   Any Other Special Instructions Will Be Listed Below (If Applicable).     If you need a refill on your cardiac medications before your next appointment, please call your pharmacy.

## 2015-10-15 NOTE — Telephone Encounter (Signed)
Pt notified of lab results by phone with verbal understanding.  

## 2015-10-15 NOTE — Progress Notes (Signed)
Cardiology Office Note Date:  10/15/2015   ID:  Lean Valliere, DOB [birth year], MRN [id number]  PCP:  Fransisca Connors, PA-C  Cardiologist:  [name], MD    Chief Complaint  Patient presents with  . Coronary Artery Disease     History of Present Illness: Lance Hancock is a 75 y.o. male who presents for Follow-up of coronary artery disease. The patient presented 08/15/2015 with an inferior STEMI. He was treated with primary PCI using overlapping drug-eluting stents in the right coronary artery. He was noted to have mild nonobstructive CAD elsewhere. Follow-up echocardiogram demonstrated preserved LV systolic function.  The patient is doing well. Eager to start doing more physical activity. Today, he denies symptoms of palpitations, chest pain, shortness of breath, orthopnea, PND, lower extremity edema, dizziness, or syncope.  Past Medical History  Diagnosis Date  . ST elevation (STEMI) myocardial infarction involving right coronary artery ([place]) 08/15/2015  . CAD (coronary artery disease)     a. STEMI 08/2015 3.5x70mm Promus DES to prox RCA, 3.0x5mm Promus DES to prox RCA.   Marland Kitchen History of echocardiogram     a. Echo 5/17: EF 55-60%, normal wall motion, normal diastolic function, MAC, moderate LAE    Past Surgical History  Procedure Laterality Date  . Eye surgery    . Breast surgery      age 27.  . Cardiac catheterization N/A 08/15/2015    Procedure: Left Heart Cath and Coronary Angiography;  Surgeon: [name], MD;  Location: [facility];  Service: Cardiovascular;  Laterality: N/A;  . Cardiac catheterization N/A 08/15/2015    Procedure: Coronary Stent Intervention;  Surgeon: [name], MD;  Location: [facility];  Service: Cardiovascular;  Laterality: N/A;  Proximal RCA  (3.5/16 and 3.0/20 Promus)    Current Outpatient Prescriptions  Medication Sig Dispense Refill  . aspirin 81 MG tablet Take 81 mg by mouth daily.    Marland Kitchen atorvastatin (LIPITOR) 80 MG tablet  Take 1 tablet (80 mg total) by mouth daily at 6 PM. 30 tablet 12  . co-enzyme Q-10 50 MG capsule Take 100 mg by mouth 2 (two) times daily.     Marland Kitchen GLUCOSAMINE-CHONDROITIN PO Take 1,200 mg by mouth daily.    . metoprolol tartrate (LOPRESSOR) 25 MG tablet Take 0.5 tablets (12.5 mg total) by mouth 2 (two) times daily. 15 tablet 12  . Multiple Vitamin (MULTIVITAMIN) tablet Take 1 tablet by mouth daily. Takes Reliv vitamin And herbs    . nitroGLYCERIN (NITROSTAT) 0.4 MG SL tablet Place 1 tablet (0.4 mg total) under the tongue every 5 (five) minutes x 3 doses as needed for chest pain. 25 tablet 1  . omega-3 acid ethyl esters (LOVAZA) 1 g capsule Take 2 capsules (2 g total) by mouth daily. 60 capsule 10  . ticagrelor (BRILINTA) 90 MG TABS tablet Take 1 tablet (90 mg total) by mouth 2 (two) times daily. 60 tablet 12   No current facility-administered medications for this visit.    Allergies:   Review of patient's allergies indicates no known allergies.   Social History:  The patient  reports that he quit smoking about 39 years ago. His smoking use included Cigarettes. He has a 36 pack-year smoking history. He does not have any smokeless tobacco history on file. He reports that he drinks alcohol. He reports that he does not use illicit drugs.   Family History:  The patient's  family history includes Heart disease in his sister; Hyperlipidemia in his mother; Hypertension  in his mother.    ROS:  Please see the history of present illness.   All other systems are reviewed and negative.    PHYSICAL EXAM: VS:  BP 132/70 mmHg  Pulse 64  Ht 5' 7.75" (1.721 m)  Wt 200 lb 12.8 oz (91.082 kg)  BMI 30.75 kg/m2 , BMI Body mass index is 30.75 kg/(m^2). GEN: Well nourished, well developed, in no acute distress HEENT: normal Neck: no JVD, no masses. No carotid bruits Cardiac: RRR without murmur or gallop                Respiratory:  clear to auscultation bilaterally, normal work of breathing GI: soft,  nontender, nondistended, + BS MS: no deformity or atrophy Ext: no pretibial edema, pedal pulses 2+= bilaterally Skin: warm and dry, no rash Neuro:  Strength and sensation are intact Psych: euthymic mood, full affect  EKG:  EKG is not ordered today.  Recent Labs: 08/15/2015: TSH 1.889 08/16/2015: BUN 12; Creatinine, Ser 1.00; Hemoglobin 14.3; Platelets 161; Potassium 4.1; Sodium 139   Lipid Panel     Component Value Date/Time   CHOL 138 08/15/2015 0351   TRIG 26 08/15/2015 0351   HDL 43 08/15/2015 0351   CHOLHDL 3.2 08/15/2015 0351   VLDL 5 08/15/2015 0351   LDLCALC 90 08/15/2015 0351      Wt Readings from Last 3 Encounters:  10/15/15 200 lb 12.8 oz (91.082 kg)  10/02/15 201 lb 15.1 oz (91.6 kg)  08/27/15 209 lb (94.802 kg)     Cardiac Studies Reviewed: 2-D echocardiogram 09/11/2015: Study Conclusions  - Left ventricle: The cavity size was normal. Systolic function was  normal. The estimated ejection fraction was in the range of 55%  to 60%. Wall motion was normal; there were no regional wall  motion abnormalities. Left ventricular diastolic function  parameters were normal. - Mitral valve: Calcified annulus. Mildly thickened leaflets . - Left atrium: The atrium was moderately dilated. - Atrial septum: No defect or patent foramen ovale was identified.  Cardiac catheterization 08/15/2015: Conclusion     Prox RCA lesion, 99% stenosed. Post intervention, there is a 0% residual stenosis.  Ost LM lesion, 30% stenosed.  There is mild left ventricular systolic dysfunction.  Mid LAD to Dist LAD lesion, 30% stenosed.  Prox Cx to Mid Cx lesion, 25% stenosed.  1. Subtotal occlusion of the RCA with TIMI 2 flow, treated successfully with overlapping drug-eluting stents in the proximal vessel 2. Mild nonobstructive plaque in the left main and LAD 3. Mild segmental LV contraction abnormality with preserved overall LV function    ASSESSMENT AND PLAN: 1.  CAD, native  vessel, without angina: Patient status post inferior MI treated with overlapping drug-eluting stents in the right coronary artery. He's tolerating medical therapy well. LV function has completely normalized. He is doing well with cardiac rehabilitation. I advised him he can resume all of his normal activities and he understands he needs to be sensible about overdoing it and staying out of the extreme heat. I will see him back in 6 months. Anticipate discontinuing Brilinta once he is 12 months out from PCI.  2. Hyperlipidemia: Last lipids reviewed. He is taking high-dose atorvastatin and also taking Lovaza. He has previously been on another form of fish oil. Because of cost, he would like to return to his previous fish oil and I told him this is fine.  Current medicines are reviewed with the patient today.  The patient does not have concerns regarding medicines.  Labs/ tests ordered today include:  No orders of the defined types were placed in this encounter.    Disposition:   FU 6 months  Signed, [name], MD  10/15/2015 9:25 AM    [facility] [place], [place], [place]  [address] Phone: [phone]; Fax: [phone]

## 2015-10-17 ENCOUNTER — Encounter (HOSPITAL_COMMUNITY)
Admission: RE | Admit: 2015-10-17 | Discharge: 2015-10-17 | Disposition: A | Discharge status: Home / Self Care | Payer: Medicare HMO | Source: Ambulatory Visit | Attending: Cardiovascular Disease | Admitting: Cardiovascular Disease

## 2015-10-17 ENCOUNTER — Ambulatory Visit (HOSPITAL_COMMUNITY): Payer: Medicare HMO

## 2015-10-17 DIAGNOSIS — I213 ST elevation (STEMI) myocardial infarction of unspecified site: Secondary | ICD-10-CM

## 2015-10-17 DIAGNOSIS — Z955 Presence of coronary angioplasty implant and graft: Secondary | ICD-10-CM

## 2015-10-19 ENCOUNTER — Ambulatory Visit (HOSPITAL_COMMUNITY): Payer: Medicare HMO

## 2015-10-19 ENCOUNTER — Encounter (HOSPITAL_COMMUNITY)
Admission: RE | Admit: 2015-10-19 | Discharge: 2015-10-19 | Disposition: A | Discharge status: Home / Self Care | Payer: Medicare HMO | Source: Ambulatory Visit | Attending: Cardiovascular Disease | Admitting: Cardiovascular Disease

## 2015-10-19 DIAGNOSIS — I213 ST elevation (STEMI) myocardial infarction of unspecified site: Secondary | ICD-10-CM | POA: Diagnosis not present

## 2015-10-19 NOTE — Progress Notes (Signed)
Reviewed home exercise with pt today.  Pt plans to walk outdoors or on treadmill and use stationary bike for exercise. Pt plans to exercise 3x/week on opposite days of CRPII.  Reviewed THR, pulse, RPE, sign and symptoms, NTG use, and when to call 911 or MD.  Also discussed weather considerations and indoor options.  Pt voiced understanding.    Larri Yehle [employer]

## 2015-10-22 ENCOUNTER — Ambulatory Visit (HOSPITAL_COMMUNITY): Payer: Medicare HMO

## 2015-10-22 ENCOUNTER — Encounter (HOSPITAL_COMMUNITY)
Admission: RE | Admit: 2015-10-22 | Discharge: 2015-10-22 | Disposition: A | Discharge status: Home / Self Care | Payer: Medicare HMO | Source: Ambulatory Visit | Attending: Cardiovascular Disease | Admitting: Cardiovascular Disease

## 2015-10-22 DIAGNOSIS — Z955 Presence of coronary angioplasty implant and graft: Secondary | ICD-10-CM

## 2015-10-22 DIAGNOSIS — I213 ST elevation (STEMI) myocardial infarction of unspecified site: Secondary | ICD-10-CM | POA: Diagnosis not present

## 2015-10-23 ENCOUNTER — Telehealth: Payer: Self-pay | Admitting: Cardiovascular Disease

## 2015-10-23 NOTE — Telephone Encounter (Signed)
Surgery clearance form from Dr. Georgie Chard (DDS) recv'd today. Richardson Dopp, [place] reviewed and with his recommendations on surgery clearance form for Dr. Georgie Chard (DDS). Form was given back to Maude Leriche, LPN who had taken original call.

## 2015-10-23 NOTE — Telephone Encounter (Signed)
Pt at dentist office today with plan to have a filling done. Dr. Georgie Chard is wanting cardiac clearance to have this completed. Advised that Dr. Burt Knack is on vacation this week but I can see if Richardson Dopp, PA-C is able to clear pt. Advised Melissa at Dr. Shon Hough office that Nicki Reaper is currently in clinic so I'm not sure when he could possibly look at pt's chart. Melissa states that will send pt home with the plan to send over a clearance form and once they receive it back then they will reschedule pt. Advised Melissa to call if she has any further questions. Dr. Ma Hillock- [phone]

## 2015-10-24 ENCOUNTER — Encounter (HOSPITAL_COMMUNITY)
Admission: RE | Admit: 2015-10-24 | Discharge: 2015-10-24 | Disposition: A | Discharge status: Home / Self Care | Payer: Medicare HMO | Source: Ambulatory Visit | Attending: Cardiovascular Disease | Admitting: Cardiovascular Disease

## 2015-10-24 ENCOUNTER — Ambulatory Visit (HOSPITAL_COMMUNITY): Payer: Medicare HMO

## 2015-10-24 DIAGNOSIS — I213 ST elevation (STEMI) myocardial infarction of unspecified site: Secondary | ICD-10-CM

## 2015-10-24 DIAGNOSIS — Z955 Presence of coronary angioplasty implant and graft: Secondary | ICD-10-CM

## 2015-10-24 NOTE — Progress Notes (Signed)
Cardiac Individual Treatment Plan  Patient Details  Name: Lance Hancock MRN: [id number] Date of Birth: 16-May-1940 Referring Provider:        CARDIAC REHAB PHASE II ORIENTATION from 10/02/2015 in [place]   Referring Provider  [name] MD      Initial Encounter Date:       CARDIAC REHAB PHASE II ORIENTATION from 10/02/2015 in [place]   Date  10/02/15   Referring Provider  [name] MD      Visit Diagnosis: ST elevation myocardial infarction (STEMI), unspecified artery ([place])  Stented coronary artery  Patient's Home Medications on Admission:  Current outpatient prescriptions:  .  aspirin 81 MG tablet, Take 81 mg by mouth daily., Disp: , Rfl:  .  atorvastatin (LIPITOR) 80 MG tablet, Take 1 tablet (80 mg total) by mouth daily at 6 PM., Disp: 30 tablet, Rfl: 12 .  co-enzyme Q-10 50 MG capsule, Take 100 mg by mouth 2 (two) times daily. , Disp: , Rfl:  .  GLUCOSAMINE-CHONDROITIN PO, Take 1,200 mg by mouth daily., Disp: , Rfl:  .  metoprolol tartrate (LOPRESSOR) 25 MG tablet, Take 0.5 tablets (12.5 mg total) by mouth 2 (two) times daily., Disp: 15 tablet, Rfl: 12 .  Multiple Vitamin (MULTIVITAMIN) tablet, Take 1 tablet by mouth daily. Takes Reliv vitamin And herbs, Disp: , Rfl:  .  nitroGLYCERIN (NITROSTAT) 0.4 MG SL tablet, Place 1 tablet (0.4 mg total) under the tongue every 5 (five) minutes x 3 doses as needed for chest pain., Disp: 25 tablet, Rfl: 1 .  ticagrelor (BRILINTA) 90 MG TABS tablet, Take 1 tablet (90 mg total) by mouth 2 (two) times daily., Disp: 60 tablet, Rfl: 12  Past Medical History: Past Medical History  Diagnosis Date  . ST elevation (STEMI) myocardial infarction involving right coronary artery ([place]) 08/15/2015  . CAD (coronary artery disease)     a. STEMI 08/2015 3.5x74mm Promus DES to prox RCA, 3.0x7mm Promus DES to prox RCA.   Marland Kitchen History of echocardiogram     a. Echo 5/17: EF 55-60%,  normal wall motion, normal diastolic function, MAC, moderate LAE    Tobacco Use: History  Smoking status  . Former Smoker -- 2.00 packs/day for 18 years  . Types: Cigarettes  . Quit date: 01/19/1976  Smokeless tobacco  . Not on file    Labs:     Recent Review Flowsheet Data    Labs for ITP Cardiac and Pulmonary Rehab Latest Ref Rng 08/15/2015   Cholestrol 0 - 200 mg/dL 138   LDLCALC 0 - 99 mg/dL 90   HDL >40 mg/dL 43   Trlycerides <150 mg/dL 26   Hemoglobin A1c 4.8 - 5.6 % 6.1(H)   TCO2 0 - 100 mmol/L 20      Capillary Blood Glucose: No results found for: GLUCAP   Exercise Target Goals:    Exercise Program Goal: Individual exercise prescription set with THRR, safety & activity barriers. Participant demonstrates ability to understand and report RPE using BORG scale, to self-measure pulse accurately, and to acknowledge the importance of the exercise prescription.  Exercise Prescription Goal: Starting with aerobic activity 30 plus minutes a day, 3 days per week for initial exercise prescription. Provide home exercise prescription and guidelines that participant acknowledges understanding prior to discharge.  Activity Barriers & Risk Stratification:     Activity Barriers & Cardiac Risk Stratification - 10/02/15 1409    Activity Barriers & Cardiac Risk Stratification  Activity Barriers None   Cardiac Risk Stratification High      6 Minute Walk:     6 Minute Walk      10/02/15 1354       6 Minute Walk   Phase Initial     Distance 1765 feet     Walk Time 6 minutes     # of Rest Breaks 0     MPH 3.34     METS 3.5     RPE 14     VO2 Peak 12.24     Symptoms Yes (comment)     Comments Leg fatigue, relieved with rest.     Resting HR 61 bpm     Resting BP 122/60 mmHg     Max Ex. HR 97 bpm     Max Ex. BP 162/72 mmHg     2 Minute Post BP 128/70 mmHg        Initial Exercise Prescription:     Initial Exercise Prescription - 10/03/15 0800    Date of  Initial Exercise RX and Referring Provider   Date 10/02/15   Referring Provider [name] MD   Treadmill   MPH 2.3   Grade 1   Minutes 10   METs 3.08   Bike   Level 1.2   Minutes 10   METs 3.5   NuStep   Level 3   Minutes 10   METs 2.5   Prescription Details   Frequency (times per week) 3   Duration Progress to 30 minutes of continuous aerobic without signs/symptoms of physical distress   Intensity   THRR 40-80% of Max Heartrate 58-117   Ratings of Perceived Exertion 11-13   Perceived Dyspnea 0-4   Progression   Progression Continue to progress workloads to maintain intensity without signs/symptoms of physical distress.   Resistance Training   Training Prescription Yes   Weight 2 lbs.   Reps 10-12      Perform Capillary Blood Glucose checks as needed.  Exercise Prescription Changes:      Exercise Prescription Changes      10/15/15 1100 10/19/15 1100 10/22/15 1700 10/31/15 1600     Exercise Review   Progression Yes  Yes Yes    Response to Exercise   Blood Pressure (Admit) 120/60 mmHg  116/62 mmHg 116/64 mmHg    Blood Pressure (Exercise) 132/72 mmHg  140/74 mmHg 124/80 mmHg    Blood Pressure (Exit) 110/62 mmHg  116/62 mmHg 118/64 mmHg    Heart Rate (Admit) 59 bpm  57 bpm 69 bpm    Heart Rate (Exercise) 88 bpm  96 bpm 92 bpm    Heart Rate (Exit) 55 bpm  56 bpm 61 bpm    Rating of Perceived Exertion (Exercise) 11  12 12     Comments  reviewed HEP on 7/717 reviewed HEP on 7/717 reviewed HEP on 7/717    Duration Progress to 30 minutes of continuous aerobic without signs/symptoms of physical distress  Progress to 30 minutes of continuous aerobic without signs/symptoms of physical distress Progress to 30 minutes of continuous aerobic without signs/symptoms of physical distress    Intensity THRR unchanged  THRR unchanged THRR unchanged    Progression   Progression Continue to progress workloads to maintain intensity without signs/symptoms of physical distress.   Continue to progress workloads to maintain intensity without signs/symptoms of physical distress. Continue to progress workloads to maintain intensity without signs/symptoms of physical distress.    Average METs 3.4  3.7 3.7  Resistance Training   Training Prescription Yes  Yes Yes    Weight 3lbs  3lbs 3lbs    Reps 10-12  10-12 10-12    Interval Training   Interval Training No       Treadmill   MPH 2.3  2.8 2.8    Grade 1  2 2     Minutes 10  10 10     METs 3.08  3.91 3.91    Bike   Level 1.2  1.5 1.8    Minutes 10  10 10     METs 3.5  4.15 4.79    NuStep   Level 3  3 4     Minutes 10  10 10     METs 3.4  3.2 3.5    Home Exercise Plan   Plans to continue exercise at  Home  reviewed on 7/717 please read progress note for details Home  reviewed on 7/717 please read progress note for details Home  reviewed on 7/717 please read progress note for details    Frequency  Add 2 additional days to program exercise sessions. Add 2 additional days to program exercise sessions. Add 2 additional days to program exercise sessions.       Exercise Comments:      Exercise Comments      10/22/15 1718           Exercise Comments Reviewed METs and goals. Pt is tolerating exercise well; minus the lower leg pain from time to time. Will continue to monitor exericise progression and lower leg weakness.          Discharge Exercise Prescription (Final Exercise Prescription Changes):     Exercise Prescription Changes - 10/31/15 1600    Exercise Review   Progression Yes   Response to Exercise   Blood Pressure (Admit) 116/64 mmHg   Blood Pressure (Exercise) 124/80 mmHg   Blood Pressure (Exit) 118/64 mmHg   Heart Rate (Admit) 69 bpm   Heart Rate (Exercise) 92 bpm   Heart Rate (Exit) 61 bpm   Rating of Perceived Exertion (Exercise) 12   Comments reviewed HEP on 7/717   Duration Progress to 30 minutes of continuous aerobic without signs/symptoms of physical distress   Intensity THRR unchanged    Progression   Progression Continue to progress workloads to maintain intensity without signs/symptoms of physical distress.   Average METs 3.7   Resistance Training   Training Prescription Yes   Weight 3lbs   Reps 10-12   Treadmill   MPH 2.8   Grade 2   Minutes 10   METs 3.91   Bike   Level 1.8   Minutes 10   METs 4.79   NuStep   Level 4   Minutes 10   METs 3.5   Home Exercise Plan   Plans to continue exercise at Home  reviewed on 7/717 please read progress note for details   Frequency Add 2 additional days to program exercise sessions.      Nutrition:  Target Goals: Understanding of nutrition guidelines, daily intake of sodium 1500mg , cholesterol 200mg , calories 30% from fat and 7% or less from saturated fats, daily to have 5 or more servings of fruits and vegetables.  Biometrics:     Pre Biometrics - 10/02/15 1354    Pre Biometrics   Height 5' 7.75" (1.721 m)   Weight 201 lb 15.1 oz (91.6 kg)   Waist Circumference 40 inches   Hip Circumference 42.75 inches   Waist to Hip Ratio 0.94 %  BMI (Calculated) 31   Triceps Skinfold 18 mm   % Body Fat 29.7 %   Grip Strength 40.5 kg   Flexibility 17 in   Single Leg Stand 5.75 seconds       Nutrition Therapy Plan and Nutrition Goals:   Nutrition Discharge: Nutrition Scores:     Nutrition Assessments - 10/22/15 1521    MEDFICTS Scores   Pre Score 40      Nutrition Goals Re-Evaluation:   Psychosocial: Target Goals: Acknowledge presence or absence of depression, maximize coping skills, provide positive support system. Participant is able to verbalize types and ability to use techniques and skills needed for reducing stress and depression.  Initial Review & Psychosocial Screening:     Initial Psych Review & Screening - 10/02/15 [place]? Yes   Barriers   Psychosocial barriers to participate in program The patient should benefit from training in stress management  and relaxation.   Screening Interventions   Interventions Encouraged to exercise      Quality of Life Scores:     Quality of Life - 10/02/15 1354    Quality of Life Scores   Health/Function Pre 27.03 %   Socioeconomic Pre 25.57 %   Psych/Spiritual Pre 28.29 %   Family Pre 27.6 %   GLOBAL Pre 27.07 %      PHQ-9:     Recent Review Flowsheet Data    Depression screen [facility] 2/9 10/08/2015   Decreased Interest 0   Down, Depressed, Hopeless 0   PHQ - 2 Score 0      Psychosocial Evaluation and Intervention:   Psychosocial Re-Evaluation:     Psychosocial Re-Evaluation      10/24/15 1702           Psychosocial Re-Evaluation   Interventions Encouraged to attend Cardiac Rehabilitation for the exercise;Stress management education;Relaxation education       Comments pt is planning to walk on his own at home.  He is presently not able to walk outside due to humidity.  pt admits he is not motivated enough to drive to indoor facility to exercise on off days. pt has treadmill at his home and plans to begin using this. pt does exhibit stress and anxiety howver this is improviing.        Continued Psychosocial Services Needed Yes          Vocational Rehabilitation: Provide vocational rehab assistance to qualifying candidates.   Vocational Rehab Evaluation & Intervention:     Vocational Rehab - 10/02/15 1604    Initial Vocational Rehab Evaluation & Intervention   Assessment shows need for Vocational Rehabilitation No      Education: Education Goals: Education classes will be provided on a weekly basis, covering required topics. Participant will state understanding/return demonstration of topics presented.  Learning Barriers/Preferences:     Learning Barriers/Preferences - 10/02/15 1604    Learning Barriers/Preferences   Learning Barriers Sight   Learning Preferences Audio;Verbal Instruction;Video;Skilled Demonstration;Written Material      Education Topics: Count  Your Pulse:  -Group instruction provided by verbal instruction, demonstration, patient participation and written materials to support subject.  Instructors address importance of being able to find your pulse and how to count your pulse when at home without a heart monitor.  Patients get hands on experience counting their pulse with staff help and individually.      CARDIAC REHAB PHASE II EXERCISE from 10/24/2015 in [place]  REHAB   Date  10/19/15   Educator  Maurice Small, RN   Instruction Review Code  2- meets goals/outcomes      Heart Attack, Angina, and Risk Factor Modification:  -Group instruction provided by verbal instruction, video, and written materials to support subject.  Instructors address signs and symptoms of angina and heart attacks.    Also discuss risk factors for heart disease and how to make changes to improve heart health risk factors.   Functional Fitness:  -Group instruction provided by verbal instruction, demonstration, patient participation, and written materials to support subject.  Instructors address safety measures for doing things around the house.  Discuss how to get up and down off the floor, how to pick things up properly, how to safely get out of a chair without assistance, and balance training.      CARDIAC REHAB PHASE II EXERCISE from 10/24/2015 in [place]   Date  10/12/15   Instruction Review Code  2- meets goals/outcomes      Meditation and Mindfulness:  -Group instruction provided by verbal instruction, patient participation, and written materials to support subject.  Instructor addresses importance of mindfulness and meditation practice to help reduce stress and improve awareness.  Instructor also leads participants through a meditation exercise.       CARDIAC REHAB PHASE II EXERCISE from 10/24/2015 in [place]   Date  10/17/15   Instruction Review Code  2-  meets goals/outcomes      Stretching for Flexibility and Mobility:  -Group instruction provided by verbal instruction, patient participation, and written materials to support subject.  Instructors lead participants through series of stretches that are designed to increase flexibility thus improving mobility.  These stretches are additional exercise for major muscle groups that are typically performed during regular warm up and cool down.   Hands Only CPR Anytime:  -Group instruction provided by verbal instruction, video, patient participation and written materials to support subject.  Instructors co-teach with AHA video for hands only CPR.  Participants get hands on experience with mannequins.   Nutrition I class: Heart Healthy Eating:  -Group instruction provided by PowerPoint slides, verbal discussion, and written materials to support subject matter. The instructor gives an explanation and review of the Therapeutic Lifestyle Changes diet recommendations, which includes a discussion on lipid goals, dietary fat, sodium, fiber, plant stanol/sterol esters, sugar, and the components of a well-balanced, healthy diet.   Nutrition II class: Lifestyle Skills:  -Group instruction provided by PowerPoint slides, verbal discussion, and written materials to support subject matter. The instructor gives an explanation and review of label reading, grocery shopping for heart health, heart healthy recipe modifications, and ways to make healthier choices when eating out.   Diabetes Question & Answer:  -Group instruction provided by PowerPoint slides, verbal discussion, and written materials to support subject matter. The instructor gives an explanation and review of diabetes co-morbidities, pre- and post-prandial blood glucose goals, pre-exercise blood glucose goals, signs, symptoms, and treatment of hypoglycemia and hyperglycemia, and foot care basics.   Diabetes Blitz:  -Group instruction provided by  PowerPoint slides, verbal discussion, and written materials to support subject matter. The instructor gives an explanation and review of the physiology behind type 1 and type 2 diabetes, diabetes medications and rational behind using different medications, pre- and post-prandial blood glucose recommendations and Hemoglobin A1c goals, diabetes diet, and exercise including blood glucose guidelines for exercising safely.    Portion Distortion:  -Group  instruction provided by PowerPoint slides, verbal discussion, written materials, and food models to support subject matter. The instructor gives an explanation of serving size versus portion size, changes in portions sizes over the last 20 years, and what consists of a serving from each food group.      CARDIAC REHAB PHASE II EXERCISE from 10/24/2015 in [place]   Date  10/10/15   Educator  RD   Instruction Review Code  2- meets goals/outcomes      Stress Management:  -Group instruction provided by verbal instruction, video, and written materials to support subject matter.  Instructors review role of stress in heart disease and how to cope with stress positively.     Exercising on Your Own:  -Group instruction provided by verbal instruction, power point, and written materials to support subject.  Instructors discuss benefits of exercise, components of exercise, frequency and intensity of exercise, and end points for exercise.  Also discuss use of nitroglycerin and activating EMS.  Review options of places to exercise outside of rehab.  Review guidelines for sex with heart disease.   Cardiac Drugs I:  -Group instruction provided by verbal instruction and written materials to support subject.  Instructor reviews cardiac drug classes: antiplatelets, anticoagulants, beta blockers, and statins.  Instructor discusses reasons, side effects, and lifestyle considerations for each drug class.   Cardiac Drugs II:  -Group  instruction provided by verbal instruction and written materials to support subject.  Instructor reviews cardiac drug classes: angiotensin converting enzyme inhibitors (ACE-I), angiotensin II receptor blockers (ARBs), nitrates, and calcium channel blockers.  Instructor discusses reasons, side effects, and lifestyle considerations for each drug class.          CARDIAC REHAB PHASE II EXERCISE from 10/24/2015 in [place]   Date  10/24/15   Instruction Review Code  2- meets goals/outcomes      Anatomy and Physiology of the Circulatory System:  -Group instruction provided by verbal instruction, video, and written materials to support subject.  Reviews functional anatomy of heart, how it relates to various diagnoses, and what role the heart plays in the overall system.   Knowledge Questionnaire Score:     Knowledge Questionnaire Score - 10/02/15 1354    Knowledge Questionnaire Score   Pre Score 22/24      Core Components/Risk Factors/Patient Goals at Admission:     Personal Goals and Risk Factors at Admission - 10/02/15 1604    Core Components/Risk Factors/Patient Goals on Admission    Weight Management Yes;Weight Loss   Intervention Weight Management: Provide education and appropriate resources to help participant work on and attain dietary goals.;Weight Management: Develop a combined nutrition and exercise program designed to reach desired caloric intake, while maintaining appropriate intake of nutrient and fiber, sodium and fats, and appropriate energy expenditure required for the weight goal.   Admit Weight 201 lb 15.1 oz (91.6 kg)   Expected Outcomes Short Term: Continue to assess and modify interventions until short term weight is achieved;Long Term: Adherence to nutrition and physical activity/exercise program aimed toward attainment of established weight goal;Weight Loss: Understanding of general recommendations for a balanced deficit meal plan, which  promotes 1-2 lb weight loss per week and includes a negative energy balance of [phone] kcal/d   Increase Strength and Stamina Yes   Intervention Provide advice, education, support and counseling about physical activity/exercise needs.;Develop an individualized exercise prescription for aerobic and resistive training based on initial evaluation findings, risk stratification,  comorbidities and participant's personal goals.   Expected Outcomes Achievement of increased cardiorespiratory fitness and enhanced flexibility, muscular endurance and strength shown through measurements of functional capacity and personal statement of participant.   Lipids Yes   Intervention Provide education and support for participant on nutrition & aerobic/resistive exercise along with prescribed medications to achieve LDL 70mg , HDL >40mg .   Expected Outcomes Short Term: Participant states understanding of desired cholesterol values and is compliant with medications prescribed. Participant is following exercise prescription and nutrition guidelines.;Long Term: Cholesterol controlled with medications as prescribed, with individualized exercise RX and with personalized nutrition plan. Value goals: LDL < 70mg , HDL > 40 mg.      Core Components/Risk Factors/Patient Goals Review:      Goals and Risk Factor Review      10/22/15 1716           Core Components/Risk Factors/Patient Goals Review   Personal Goals Review Other       Review Pt has started HEP and notice some weakness in legs. Will adjust program to cater to  strengthening the lower extremity        Expected Outcomes Pt will have strength in legs and be able to continue with CR and HEP          Core Components/Risk Factors/Patient Goals at Discharge (Final Review):      Goals and Risk Factor Review - 10/22/15 1716    Core Components/Risk Factors/Patient Goals Review   Personal Goals Review Other   Review Pt has started HEP and notice some weakness in legs.  Will adjust program to cater to  strengthening the lower extremity    Expected Outcomes Pt will have strength in legs and be able to continue with CR and HEP      ITP Comments:     ITP Comments      10/02/15 1602           ITP Comments Dr. Fransico Him, Medical Director           Comments:Pt is making expected progress toward personal goals after completing 9 sessions. Recommend continued exercise and life style modification education including  stress management and relaxation techniques to decrease cardiac risk profile.

## 2015-10-24 NOTE — Progress Notes (Signed)
Quality of life reviewed with patient today. Pt has very good scores without concerns. Pt exhibits positive upbeat coping skills although pt does exhibit stress and anxiety tendancies.  Will continue to monitor. Pt offered emotional support and reassurance.

## 2015-10-26 ENCOUNTER — Ambulatory Visit (HOSPITAL_COMMUNITY): Payer: Medicare HMO

## 2015-10-26 ENCOUNTER — Encounter (HOSPITAL_COMMUNITY)
Admission: RE | Admit: 2015-10-26 | Discharge: 2015-10-26 | Disposition: A | Discharge status: Home / Self Care | Payer: Medicare HMO | Source: Ambulatory Visit | Attending: Cardiovascular Disease | Admitting: Cardiovascular Disease

## 2015-10-26 DIAGNOSIS — I213 ST elevation (STEMI) myocardial infarction of unspecified site: Secondary | ICD-10-CM

## 2015-10-26 DIAGNOSIS — Z955 Presence of coronary angioplasty implant and graft: Secondary | ICD-10-CM

## 2015-10-29 ENCOUNTER — Ambulatory Visit (HOSPITAL_COMMUNITY): Payer: Medicare HMO

## 2015-10-29 ENCOUNTER — Encounter (HOSPITAL_COMMUNITY)
Admission: RE | Admit: 2015-10-29 | Discharge: 2015-10-29 | Disposition: A | Discharge status: Home / Self Care | Payer: Medicare HMO | Source: Ambulatory Visit | Attending: Cardiovascular Disease | Admitting: Cardiovascular Disease

## 2015-10-29 DIAGNOSIS — I213 ST elevation (STEMI) myocardial infarction of unspecified site: Secondary | ICD-10-CM

## 2015-10-29 DIAGNOSIS — Z955 Presence of coronary angioplasty implant and graft: Secondary | ICD-10-CM

## 2015-10-31 ENCOUNTER — Encounter (HOSPITAL_COMMUNITY)
Admission: RE | Admit: 2015-10-31 | Discharge: 2015-10-31 | Disposition: A | Discharge status: Home / Self Care | Payer: Medicare HMO | Source: Ambulatory Visit | Attending: Cardiovascular Disease | Admitting: Cardiovascular Disease

## 2015-10-31 ENCOUNTER — Ambulatory Visit (HOSPITAL_COMMUNITY): Payer: Medicare HMO

## 2015-10-31 DIAGNOSIS — I213 ST elevation (STEMI) myocardial infarction of unspecified site: Secondary | ICD-10-CM | POA: Diagnosis not present

## 2015-10-31 DIAGNOSIS — Z955 Presence of coronary angioplasty implant and graft: Secondary | ICD-10-CM

## 2015-11-02 ENCOUNTER — Ambulatory Visit (HOSPITAL_COMMUNITY): Payer: Medicare HMO

## 2015-11-02 ENCOUNTER — Encounter (HOSPITAL_COMMUNITY)
Admission: RE | Admit: 2015-11-02 | Discharge: 2015-11-02 | Disposition: A | Discharge status: Home / Self Care | Payer: Medicare HMO | Source: Ambulatory Visit | Attending: Cardiovascular Disease | Admitting: Cardiovascular Disease

## 2015-11-02 DIAGNOSIS — Z955 Presence of coronary angioplasty implant and graft: Secondary | ICD-10-CM

## 2015-11-02 DIAGNOSIS — I213 ST elevation (STEMI) myocardial infarction of unspecified site: Secondary | ICD-10-CM | POA: Diagnosis not present

## 2015-11-05 ENCOUNTER — Ambulatory Visit (HOSPITAL_COMMUNITY): Payer: Medicare HMO

## 2015-11-05 ENCOUNTER — Encounter (HOSPITAL_COMMUNITY)
Admission: RE | Admit: 2015-11-05 | Discharge: 2015-11-05 | Disposition: A | Discharge status: Home / Self Care | Payer: Medicare HMO | Source: Ambulatory Visit | Attending: Cardiovascular Disease | Admitting: Cardiovascular Disease

## 2015-11-05 DIAGNOSIS — I213 ST elevation (STEMI) myocardial infarction of unspecified site: Secondary | ICD-10-CM

## 2015-11-05 DIAGNOSIS — Z955 Presence of coronary angioplasty implant and graft: Secondary | ICD-10-CM

## 2015-11-07 ENCOUNTER — Encounter (HOSPITAL_COMMUNITY)
Admission: RE | Admit: 2015-11-07 | Discharge: 2015-11-07 | Disposition: A | Discharge status: Home / Self Care | Payer: Medicare HMO | Source: Ambulatory Visit | Attending: Cardiovascular Disease | Admitting: Cardiovascular Disease

## 2015-11-07 ENCOUNTER — Ambulatory Visit (HOSPITAL_COMMUNITY): Payer: Medicare HMO

## 2015-11-07 DIAGNOSIS — I213 ST elevation (STEMI) myocardial infarction of unspecified site: Secondary | ICD-10-CM | POA: Diagnosis not present

## 2015-11-07 DIAGNOSIS — Z955 Presence of coronary angioplasty implant and graft: Secondary | ICD-10-CM

## 2015-11-09 ENCOUNTER — Ambulatory Visit (HOSPITAL_COMMUNITY): Payer: Medicare HMO

## 2015-11-09 ENCOUNTER — Encounter (HOSPITAL_COMMUNITY)
Admission: RE | Admit: 2015-11-09 | Discharge: 2015-11-09 | Disposition: A | Discharge status: Home / Self Care | Payer: Medicare HMO | Source: Ambulatory Visit | Attending: Cardiovascular Disease | Admitting: Cardiovascular Disease

## 2015-11-09 DIAGNOSIS — I213 ST elevation (STEMI) myocardial infarction of unspecified site: Secondary | ICD-10-CM

## 2015-11-09 DIAGNOSIS — Z955 Presence of coronary angioplasty implant and graft: Secondary | ICD-10-CM

## 2015-11-12 ENCOUNTER — Ambulatory Visit (HOSPITAL_COMMUNITY): Payer: Medicare HMO

## 2015-11-12 ENCOUNTER — Encounter (HOSPITAL_COMMUNITY)
Admission: RE | Admit: 2015-11-12 | Discharge: 2015-11-12 | Disposition: A | Discharge status: Home / Self Care | Payer: Medicare HMO | Source: Ambulatory Visit | Attending: Cardiovascular Disease | Admitting: Cardiovascular Disease

## 2015-11-12 DIAGNOSIS — I213 ST elevation (STEMI) myocardial infarction of unspecified site: Secondary | ICD-10-CM

## 2015-11-12 DIAGNOSIS — Z955 Presence of coronary angioplasty implant and graft: Secondary | ICD-10-CM

## 2015-11-14 ENCOUNTER — Ambulatory Visit (HOSPITAL_COMMUNITY): Payer: Medicare HMO

## 2015-11-14 ENCOUNTER — Encounter (HOSPITAL_COMMUNITY)
Admission: RE | Admit: 2015-11-14 | Discharge: 2015-11-14 | Disposition: A | Discharge status: Home / Self Care | Payer: Medicare HMO | Source: Ambulatory Visit | Attending: Cardiovascular Disease | Admitting: Cardiovascular Disease

## 2015-11-14 DIAGNOSIS — Z7982 Long term (current) use of aspirin: Secondary | ICD-10-CM | POA: Insufficient documentation

## 2015-11-14 DIAGNOSIS — Z79899 Other long term (current) drug therapy: Secondary | ICD-10-CM | POA: Diagnosis not present

## 2015-11-14 DIAGNOSIS — I213 ST elevation (STEMI) myocardial infarction of unspecified site: Secondary | ICD-10-CM

## 2015-11-14 DIAGNOSIS — Z87891 Personal history of nicotine dependence: Secondary | ICD-10-CM | POA: Insufficient documentation

## 2015-11-14 DIAGNOSIS — Z955 Presence of coronary angioplasty implant and graft: Secondary | ICD-10-CM | POA: Insufficient documentation

## 2015-11-16 ENCOUNTER — Ambulatory Visit (HOSPITAL_COMMUNITY): Payer: Medicare HMO

## 2015-11-16 ENCOUNTER — Encounter (HOSPITAL_COMMUNITY)
Admission: RE | Admit: 2015-11-16 | Discharge: 2015-11-16 | Disposition: A | Discharge status: Home / Self Care | Payer: Medicare HMO | Source: Ambulatory Visit | Attending: Cardiovascular Disease | Admitting: Cardiovascular Disease

## 2015-11-16 DIAGNOSIS — I213 ST elevation (STEMI) myocardial infarction of unspecified site: Secondary | ICD-10-CM

## 2015-11-16 DIAGNOSIS — Z955 Presence of coronary angioplasty implant and graft: Secondary | ICD-10-CM

## 2015-11-19 ENCOUNTER — Ambulatory Visit (HOSPITAL_COMMUNITY): Payer: Medicare HMO

## 2015-11-19 ENCOUNTER — Encounter (HOSPITAL_COMMUNITY)
Admission: RE | Admit: 2015-11-19 | Discharge: 2015-11-19 | Disposition: A | Discharge status: Home / Self Care | Payer: Medicare HMO | Source: Ambulatory Visit | Attending: Cardiovascular Disease | Admitting: Cardiovascular Disease

## 2015-11-19 DIAGNOSIS — I213 ST elevation (STEMI) myocardial infarction of unspecified site: Secondary | ICD-10-CM

## 2015-11-19 DIAGNOSIS — Z955 Presence of coronary angioplasty implant and graft: Secondary | ICD-10-CM

## 2015-11-21 ENCOUNTER — Encounter (HOSPITAL_COMMUNITY)
Admission: RE | Admit: 2015-11-21 | Discharge: 2015-11-21 | Disposition: A | Discharge status: Home / Self Care | Payer: Medicare HMO | Source: Ambulatory Visit | Attending: Cardiovascular Disease | Admitting: Cardiovascular Disease

## 2015-11-21 ENCOUNTER — Ambulatory Visit (HOSPITAL_COMMUNITY): Payer: Medicare HMO

## 2015-11-21 DIAGNOSIS — I213 ST elevation (STEMI) myocardial infarction of unspecified site: Secondary | ICD-10-CM

## 2015-11-21 DIAGNOSIS — Z955 Presence of coronary angioplasty implant and graft: Secondary | ICD-10-CM

## 2015-11-21 NOTE — Progress Notes (Signed)
Lance Hancock 75 y.o. male Nutrition Note Spoke with pt. Nutrition Plan and Nutrition Survey goals reviewed with pt. Pt is following Step 1 of the Therapeutic Lifestyle Changes diet. Pt has lost wt by being more disciplined with his diet. Wt loss tips reviewed. Pt is pre-diabetic according to pt's A1c in the hospital. Pt's A1c significantly improved according to labs drawn at [facility]. A1c was 5.6 on 10/11/15. Pt is aware of pre-diabetes and monitors A1c closely with his MD. Pt expressed understanding of the information reviewed via feedback method. Pt aware of nutrition education classes offered.  Lab Results  Component Value Date   HGBA1C 6.1 (H) 08/15/2015   Wt Readings from Last 3 Encounters:  10/15/15 200 lb 12.8 oz (91.1 kg)  10/02/15 201 lb 15.1 oz (91.6 kg)  08/27/15 209 lb (94.8 kg)    Nutrition Diagnosis ? Food-and nutrition-related knowledge deficit related to lack of exposure to information as related to diagnosis of: ? CVD ? Pre-DM ? Obesity related to excessive energy intake as evidenced by a BMI of 31  Nutrition Intervention ? Pt's individual nutrition plan reviewed with pt. ? Benefits of adopting Therapeutic Lifestyle Changes discussed when Medficts reviewed. ? Pt to attend the Portion Distortion class ? Pt to attend the   ? Nutrition I class                     ? Nutrition II class ?  Pt given handouts for: ? Nutrition I class ? Nutrition II class  ? Continue client-centered nutrition education by RD, as part of interdisciplinary care. Goal(s) ? Pt to identify and limit food sources of saturated fat, trans fat, and sodium ? Pt to identify food quantities necessary to achieve weight loss of 6-24 lb (2.7-10.9 kg) at graduation from cardiac rehab.  Monitor and Evaluate progress toward nutrition goal with team. Derek Mound, M.Ed, RD, LDN, CDE 11/21/2015 9:25 AM

## 2015-11-22 ENCOUNTER — Telehealth: Payer: Self-pay | Admitting: Cardiovascular Disease

## 2015-11-22 NOTE — Telephone Encounter (Signed)
New Message  MedIssue 1. Brilinta and Metoprolol 2. Brilinta-90mg  and Metoprolol-25mg  (both twice daily, 6-7 am & 6-7 pm) 3. No 4. Pt verbalized he missed morning dosage and questioning if it's too close to take 1st dosage when 2nd dosage is due at 6pm.  Pt verbalized if no answer, ok to lm/answer to his question on his vm.  Please follow up with pt.

## 2015-11-22 NOTE — Telephone Encounter (Signed)
Calling stating that he was putting his medications out in pill box this AM and got distracted and forgot to take his Brilinta 90 mg and Metoprolol 25 mg this AM.  Wants to know ok for him to take dose now and then again at 6:00-7:00 tonight.  Advised that both medications should be taken 12 hr apart.  He states he takes his medications regularly every day just missed today.  Just stressed the importance of taking medication as directed. He verbalized understanding.

## 2015-11-23 ENCOUNTER — Ambulatory Visit (HOSPITAL_COMMUNITY): Payer: Medicare HMO

## 2015-11-23 ENCOUNTER — Encounter (HOSPITAL_COMMUNITY)
Admission: RE | Admit: 2015-11-23 | Discharge: 2015-11-23 | Disposition: A | Discharge status: Home / Self Care | Payer: Medicare HMO | Source: Ambulatory Visit | Attending: Cardiovascular Disease | Admitting: Cardiovascular Disease

## 2015-11-23 DIAGNOSIS — Z955 Presence of coronary angioplasty implant and graft: Secondary | ICD-10-CM

## 2015-11-23 DIAGNOSIS — I213 ST elevation (STEMI) myocardial infarction of unspecified site: Secondary | ICD-10-CM

## 2015-11-26 ENCOUNTER — Encounter (HOSPITAL_COMMUNITY): Payer: Medicare HMO

## 2015-11-26 ENCOUNTER — Ambulatory Visit (HOSPITAL_COMMUNITY): Payer: Medicare HMO

## 2015-11-28 ENCOUNTER — Ambulatory Visit (HOSPITAL_COMMUNITY): Payer: Medicare HMO

## 2015-11-28 ENCOUNTER — Encounter (HOSPITAL_COMMUNITY): Payer: Medicare HMO

## 2015-11-28 NOTE — Progress Notes (Signed)
Cardiac Individual Treatment Plan  Patient Details  Name: Vernie Piet MRN: [id number] Date of Birth: [birth year] Referring Provider:   Flowsheet Row CARDIAC REHAB PHASE II ORIENTATION from 10/02/2015 in [place] [facility]  Referring Provider  [name] MD      Initial Encounter Date:  [place] PHASE II ORIENTATION from 10/02/2015 in [facility]  Date  10/02/15  Referring Provider  [name] MD      Visit Diagnosis: ST elevation myocardial infarction (STEMI), unspecified artery ([place])  Stented coronary artery  Patient's Home Medications on Admission:  Current Outpatient Prescriptions:  .  aspirin 81 MG tablet, Take 81 mg by mouth daily., Disp: , Rfl:  .  atorvastatin (LIPITOR) 80 MG tablet, Take 1 tablet (80 mg total) by mouth daily at 6 PM., Disp: 30 tablet, Rfl: 12 .  co-enzyme Q-10 50 MG capsule, Take 100 mg by mouth 2 (two) times daily. , Disp: , Rfl:  .  GLUCOSAMINE-CHONDROITIN PO, Take 1,200 mg by mouth daily., Disp: , Rfl:  .  metoprolol tartrate (LOPRESSOR) 25 MG tablet, Take 0.5 tablets (12.5 mg total) by mouth 2 (two) times daily., Disp: 15 tablet, Rfl: 12 .  Multiple Vitamin (MULTIVITAMIN) tablet, Take 1 tablet by mouth daily. Takes Reliv vitamin And herbs, Disp: , Rfl:  .  nitroGLYCERIN (NITROSTAT) 0.4 MG SL tablet, Place 1 tablet (0.4 mg total) under the tongue every 5 (five) minutes x 3 doses as needed for chest pain., Disp: 25 tablet, Rfl: 1 .  ticagrelor (BRILINTA) 90 MG TABS tablet, Take 1 tablet (90 mg total) by mouth 2 (two) times daily., Disp: 60 tablet, Rfl: 12  Past Medical History: Past Medical History:  Diagnosis Date  . CAD (coronary artery disease)    a. STEMI 08/2015 3.5x33m Promus DES to prox RCA, 3.0x284mPromus DES to prox RCA.   . [facility] of echocardiogram    a. Echo 5/17: EF 55-60%, normal wall motion, normal diastolic function, MAC, moderate LAE  . ST elevation  (STEMI) [place]    Tobacco Use: History  Smoking Status  . Former Smoker  . Packs/day: 2.00  . Years: 18.00  . Types: Cigarettes  . Quit date: 01/19/1976  Smokeless Tobacco  . Not on file    Labs: Recent Review Flowsheet Data    Labs for ITP Cardiac and Pulmonary Rehab Latest Ref Rng & Units 08/15/2015   Cholestrol 0 - 200 mg/dL 138   LDLCALC 0 - 99 mg/dL 90   HDL >40 mg/dL 43   Trlycerides <150 mg/dL 26   Hemoglobin A1c 4.8 - 5.6 % 6.1(H)   TCO2 0 - 100 mmol/L 20      Capillary Blood Glucose: No results found for: GLUCAP   Exercise Target Goals:    Exercise Program Goal: Individual exercise prescription set with THRR, safety & activity barriers. Participant demonstrates ability to understand and report RPE using BORG scale, to self-measure pulse accurately, and to acknowledge the importance of the exercise prescription.  Exercise Prescription Goal: Starting with aerobic activity 30 plus minutes a day, 3 days per week for initial exercise prescription. Provide home exercise prescription and guidelines that participant acknowledges understanding prior to discharge.  Activity Barriers & Risk Stratification:     Activity Barriers & Cardiac Risk Stratification - 10/02/15 1409      Activity Barriers & Cardiac Risk Stratification   Activity Barriers None   Cardiac Risk Stratification  High      6 Minute Walk:     6 Minute Walk    Row Name 10/02/15 1354         6 Minute Walk   Phase Initial     Distance 1765 feet     Walk Time 6 minutes     # of Rest Breaks 0     MPH 3.34     METS 3.5     RPE 14     VO2 Peak 12.24     Symptoms Yes (comment)     Comments Leg fatigue, relieved with rest.     Resting HR 61 bpm     Resting BP 122/60     Max Ex. HR 97 bpm     Max Ex. BP 162/72     2 Minute Post BP 128/70        Initial Exercise Prescription:     Initial Exercise Prescription - 10/03/15 0800       Date of Initial Exercise RX and Referring Provider   Date 10/02/15   Referring Provider [name] MD     Treadmill   MPH 2.3   Grade 1   Minutes 10   METs 3.08     Bike   Level 1.2   Minutes 10   METs 3.5     NuStep   Level 3   Minutes 10   METs 2.5     Prescription Details   Frequency (times per week) 3   Duration Progress to 30 minutes of continuous aerobic without signs/symptoms of physical distress     Intensity   THRR 40-80% of Max Heartrate 58-117   Ratings of Perceived Exertion 11-13   Perceived Dyspnea 0-4     Progression   Progression Continue to progress workloads to maintain intensity without signs/symptoms of physical distress.     Resistance Training   Training Prescription Yes   Weight 2 lbs.   Reps 10-12      Perform Capillary Blood Glucose checks as needed.  Exercise Prescription Changes:     Exercise Prescription Changes    Row Name 10/15/15 1100 10/19/15 1100 10/22/15 1700 10/31/15 1600 11/19/15 1000     Exercise Review   Progression Yes  - Yes Yes Yes     Response to Exercise   Blood Pressure (Admit) 120/60  - 116/62 116/64 110/60   Blood Pressure (Exercise) 132/72  - 140/74 124/80 140/74   Blood Pressure (Exit) 110/62  - 116/62 118/64 124/70   Heart Rate (Admit) 59 bpm  - 57 bpm 69 bpm 57 bpm   Heart Rate (Exercise) 88 bpm  - 96 bpm 92 bpm 96 bpm   Heart Rate (Exit) 55 bpm  - 56 bpm 61 bpm 56 bpm   Rating of Perceived Exertion (Exercise) 11  - 12 12 11    Comments  - reviewed HEP on 7/717 reviewed HEP on 7/717 reviewed HEP on 7/717 reviewed HEP on 7/717   Duration Progress to 30 minutes of continuous aerobic without signs/symptoms of physical distress  - Progress to 30 minutes of continuous aerobic without signs/symptoms of physical distress Progress to 30 minutes of continuous aerobic without signs/symptoms of physical distress Progress to 30 minutes of continuous aerobic without signs/symptoms of physical distress    Intensity THRR unchanged  - THRR unchanged THRR unchanged THRR unchanged     Progression   Progression Continue to progress workloads to maintain intensity without signs/symptoms of physical distress.  - Continue  to progress workloads to maintain intensity without signs/symptoms of physical distress. Continue to progress workloads to maintain intensity without signs/symptoms of physical distress. Continue to progress workloads to maintain intensity without signs/symptoms of physical distress.   Average METs 3.4  - 3.7 3.7 4.5     Resistance Training   Training Prescription Yes  - Yes Yes Yes   Weight 3lbs  - 3lbs 3lbs 4lbs   Reps 10-12  - 10-12 10-12 10-12     Interval Training   Interval Training No  -  -  -  -     Treadmill   MPH 2.3  - 2.8 2.8 3.3   Grade 1  - 2 2 3    Minutes 10  - 10 10 10    METs 3.08  - 3.91 3.91 4.89     Bike   Level 1.2  - 1.5 1.8 1.8   Minutes 10  - 10 10 10    METs 3.5  - 4.15 4.79 4.82     NuStep   Level 3  - 3 4 5    Minutes 10  - 10 10 10    METs 3.4  - 3.2 3.5 4.2     Home Exercise Plan   Plans to continue exercise at  - Home  reviewed on 7/717 please read progress note for details Home  reviewed on 7/717 please read progress note for details Home  reviewed on 7/717 please read progress note for details Home  reviewed on 7/717 please read progress note for details   Frequency  - Add 2 additional days to program exercise sessions. Add 2 additional days to program exercise sessions. Add 2 additional days to program exercise sessions. Add 2 additional days to program exercise sessions.      Exercise Comments:     Exercise Comments    Row Name 10/22/15 1718 11/19/15 1037         Exercise Comments Reviewed METs and goals. Pt is tolerating exercise well; minus the lower leg pain from time to time. Will continue to monitor exericise progression and lower leg weakness. Reviewed METs and goals. Pt is tolerating exercise well; minus the lower leg pain  from time to time. Will continue to monitor exericise progression and lower leg weakness.         Discharge Exercise Prescription (Final Exercise Prescription Changes):     Exercise Prescription Changes - 11/19/15 1000      Exercise Review   Progression Yes     Response to Exercise   Blood Pressure (Admit) 110/60   Blood Pressure (Exercise) 140/74   Blood Pressure (Exit) 124/70   Heart Rate (Admit) 57 bpm   Heart Rate (Exercise) 96 bpm   Heart Rate (Exit) 56 bpm   Rating of Perceived Exertion (Exercise) 11   Comments reviewed HEP on 7/717   Duration Progress to 30 minutes of continuous aerobic without signs/symptoms of physical distress   Intensity THRR unchanged     Progression   Progression Continue to progress workloads to maintain intensity without signs/symptoms of physical distress.   Average METs 4.5     Resistance Training   Training Prescription Yes   Weight 4lbs   Reps 10-12     Treadmill   MPH 3.3   Grade 3   Minutes 10   METs 4.89     Bike   Level 1.8   Minutes 10   METs 4.82     NuStep   Level 5   Minutes  10   METs 4.2     Home Exercise Plan   Plans to continue exercise at Home  reviewed on 7/717 please read progress note for details   Frequency Add 2 additional days to program exercise sessions.      Nutrition:  Target Goals: Understanding of nutrition guidelines, daily intake of sodium 1500mg , cholesterol 200mg , calories 30% from fat and 7% or less from saturated fats, daily to have 5 or more servings of fruits and vegetables.  Biometrics:     Pre Biometrics - 10/02/15 1354      Pre Biometrics   Height 5' 7.75" (1.721 m)   Weight 201 lb 15.1 oz (91.6 kg)   Waist Circumference 40 inches   Hip Circumference 42.75 inches   Waist to Hip Ratio 0.94 %   BMI (Calculated) 31   Triceps Skinfold 18 mm   % Body Fat 29.7 %   Grip Strength 40.5 kg   Flexibility 17 in   Single Leg Stand 5.75 seconds       Nutrition Therapy Plan and  Nutrition Goals:   Nutrition Discharge: Nutrition Scores:     Nutrition Assessments - 10/22/15 1521      MEDFICTS Scores   Pre Score 40      Nutrition Goals Re-Evaluation:   Psychosocial: Target Goals: Acknowledge presence or absence of depression, maximize coping skills, provide positive support system. Participant is able to verbalize types and ability to use techniques and skills needed for reducing stress and depression.  Initial Review & Psychosocial Screening:     Initial Psych Review & Screening - 10/02/15 [place]? Yes     Barriers   Psychosocial barriers to participate in program The patient should benefit from training in stress management and relaxation.     Screening Interventions   Interventions Encouraged to exercise      Quality of Life Scores:     Quality of Life - 10/02/15 1354      Quality of Life Scores   Health/Function Pre 27.03 %   Socioeconomic Pre 25.57 %   Psych/Spiritual Pre 28.29 %   Family Pre 27.6 %   GLOBAL Pre 27.07 %      PHQ-9: Recent Review Flowsheet Data    Depression screen [facility] 2/9 10/08/2015   Decreased Interest 0   Down, Depressed, Hopeless 0   PHQ - 2 Score 0      Psychosocial Evaluation and Intervention:   Psychosocial Re-Evaluation:     Psychosocial Re-Evaluation    Row Name 10/24/15 1702 11/28/15 1703           Psychosocial Re-Evaluation   Interventions Encouraged to attend Cardiac Rehabilitation for the exercise;Stress management education;Relaxation education Stress management education;Relaxation education;Encouraged to attend Cardiac Rehabilitation for the exercise      Comments pt is planning to walk on his own at home.  He is presently not able to walk outside due to humidity.  pt admits he is not motivated enough to drive to indoor facility to exercise on off days. pt has treadmill at his home and plans to begin using this. pt does exhibit stress and anxiety  howver this is improviing.  pt health related anxiety is decreasing. pt reports he does feel like he is getting stronger however still not as commited to exercising on his own.  pt is looking forward to his beach vacation and plans to walk the beach frequently  Continued Psychosocial Services Needed Yes Yes         Vocational Rehabilitation: Provide vocational rehab assistance to qualifying candidates.   Vocational Rehab Evaluation & Intervention:     Vocational Rehab - 10/02/15 1604      Initial Vocational Rehab Evaluation & Intervention   Assessment shows need for Vocational Rehabilitation No      Education: Education Goals: Education classes will be provided on a weekly basis, covering required topics. Participant will state understanding/return demonstration of topics presented.  Learning Barriers/Preferences:     Learning Barriers/Preferences - 10/02/15 1604      Learning Barriers/Preferences   Learning Barriers Sight   Learning Preferences Audio;Verbal Instruction;Video;Skilled Demonstration;Written Material      Education Topics: Count Your Pulse:  -Group instruction provided by verbal instruction, demonstration, patient participation and written materials to support subject.  Instructors address importance of being able to find your pulse and how to count your pulse when at home without a heart monitor.  Patients get hands on experience counting their pulse with staff help and individually. Flowsheet Row CARDIAC REHAB PHASE II EXERCISE from 11/23/2015 in [facility]  Date  10/19/15  Educator  Maurice Small, RN  Instruction Review Code  2- meets goals/outcomes      Heart Attack, Angina, and Risk Factor Modification:  -Group instruction provided by verbal instruction, video, and written materials to support subject.  Instructors address signs and symptoms of angina and heart attacks.    Also discuss risk factors for heart disease  and how to make changes to improve heart health risk factors.   Functional Fitness:  -Group instruction provided by verbal instruction, demonstration, patient participation, and written materials to support subject.  Instructors address safety measures for doing things around the house.  Discuss how to get up and down off the floor, how to pick things up properly, how to safely get out of a chair without assistance, and balance training. Flowsheet Row CARDIAC REHAB PHASE II EXERCISE from 11/23/2015 in [facility]  Date  11/02/15  Instruction Review Code  2- meets goals/outcomes      Meditation and Mindfulness:  -Group instruction provided by verbal instruction, patient participation, and written materials to support subject.  Instructor addresses importance of mindfulness and meditation practice to help reduce stress and improve awareness.  Instructor also leads participants through a meditation exercise.  Flowsheet Row CARDIAC REHAB PHASE II EXERCISE from 11/23/2015 in [facility]  Date  10/17/15  Instruction Review Code  2- meets goals/outcomes      Stretching for Flexibility and Mobility:  -Group instruction provided by verbal instruction, patient participation, and written materials to support subject.  Instructors lead participants through series of stretches that are designed to increase flexibility thus improving mobility.  These stretches are additional exercise for major muscle groups that are typically performed during regular warm up and cool down. Flowsheet Row CARDIAC REHAB PHASE II EXERCISE from 11/23/2015 in [facility]  Date  11/09/15  Instruction Review Code  2- meets goals/outcomes      Hands Only CPR Anytime:  -Group instruction provided by verbal instruction, video, patient participation and written materials to support subject.  Instructors co-teach with AHA video for hands only  CPR.  Participants get hands on experience with mannequins.   Nutrition I class: Heart Healthy Eating:  -Group instruction provided by PowerPoint slides, verbal discussion, and written materials to support subject  matter. The instructor gives an explanation and review of the Therapeutic Lifestyle Changes diet recommendations, which includes a discussion on lipid goals, dietary fat, sodium, fiber, plant stanol/sterol esters, sugar, and the components of a well-balanced, healthy diet. Flowsheet Row CARDIAC REHAB PHASE II EXERCISE from 11/23/2015 in [place]  Date  11/21/15  Educator  RD  Instruction Review Code  Not applicable [class handouts given]      Nutrition II class: Lifestyle Skills:  -Group instruction provided by PowerPoint slides, verbal discussion, and written materials to support subject matter. The instructor gives an explanation and review of label reading, grocery shopping for heart health, heart healthy recipe modifications, and ways to make healthier choices when eating out. Flowsheet Row CARDIAC REHAB PHASE II EXERCISE from 11/23/2015 in [facility]  Date  11/21/15  Educator  RD  Instruction Review Code  Not applicable [class handouts given]      Diabetes Question & Answer:  -Group instruction provided by PowerPoint slides, verbal discussion, and written materials to support subject matter. The instructor gives an explanation and review of diabetes co-morbidities, pre- and post-prandial blood glucose goals, pre-exercise blood glucose goals, signs, symptoms, and treatment of hypoglycemia and hyperglycemia, and foot care basics. Flowsheet Row CARDIAC REHAB PHASE II EXERCISE from 11/23/2015 in [facility]  Date  11/23/15  Educator  RD  Instruction Review Code  2- meets goals/outcomes      Diabetes Blitz:  -Group instruction provided by PowerPoint slides, verbal discussion, and  written materials to support subject matter. The instructor gives an explanation and review of the physiology behind type 1 and type 2 diabetes, diabetes medications and rational behind using different medications, pre- and post-prandial blood glucose recommendations and Hemoglobin A1c goals, diabetes diet, and exercise including blood glucose guidelines for exercising safely.    Portion Distortion:  -Group instruction provided by PowerPoint slides, verbal discussion, written materials, and food models to support subject matter. The instructor gives an explanation of serving size versus portion size, changes in portions sizes over the last 20 years, and what consists of a serving from each food group. Flowsheet Row CARDIAC REHAB PHASE II EXERCISE from 11/23/2015 in [facility]  Date  11/21/15  Educator  RD  Instruction Review Code  2- meets goals/outcomes      Stress Management:  -Group instruction provided by verbal instruction, video, and written materials to support subject matter.  Instructors review role of stress in heart disease and how to cope with stress positively.   Flowsheet Row CARDIAC REHAB PHASE II EXERCISE from 11/23/2015 in [place] [place]  Date  11/14/15  Instruction Review Code  2- meets goals/outcomes      Exercising on Your Own:  -Group instruction provided by verbal instruction, power point, and written materials to support subject.  Instructors discuss benefits of exercise, components of exercise, frequency and intensity of exercise, and end points for exercise.  Also discuss use of nitroglycerin and activating EMS.  Review options of places to exercise outside of rehab.  Review guidelines for sex with heart disease. Flowsheet Row CARDIAC REHAB PHASE II EXERCISE from 11/23/2015 in [place]  Date  11/07/15  Instruction Review Code  2- meets goals/outcomes      Cardiac Drugs I:   -Group instruction provided by verbal instruction and written materials to support subject.  Instructor reviews cardiac drug classes: antiplatelets, anticoagulants,  beta blockers, and statins.  Instructor discusses reasons, side effects, and lifestyle considerations for each drug class.   Cardiac Drugs II:  -Group instruction provided by verbal instruction and written materials to support subject.  Instructor reviews cardiac drug classes: angiotensin converting enzyme inhibitors (ACE-I), angiotensin II receptor blockers (ARBs), nitrates, and calcium channel blockers.  Instructor discusses reasons, side effects, and lifestyle considerations for each drug class. Flowsheet Row CARDIAC REHAB PHASE II EXERCISE from 11/23/2015 in [facility] [place]  Date  10/24/15  Instruction Review Code  2- meets goals/outcomes      Anatomy and Physiology of the Circulatory System:  -Group instruction provided by verbal instruction, video, and written materials to support subject.  Reviews functional anatomy of heart, how it relates to various diagnoses, and what role the heart plays in the overall system. Flowsheet Row CARDIAC REHAB PHASE II EXERCISE from 11/23/2015 in [place]  Date  10/31/15  Instruction Review Code  2- meets goals/outcomes      Knowledge Questionnaire Score:     Knowledge Questionnaire Score - 10/02/15 1354      Knowledge Questionnaire Score   Pre Score 22/24      Core Components/Risk Factors/Patient Goals at Admission:     Personal Goals and Risk Factors at Admission - 10/02/15 1604      Core Components/Risk Factors/Patient Goals on Admission    Weight Management Yes;Weight Loss   Intervention Weight Management: Provide education and appropriate resources to help participant work on and attain dietary goals.;Weight Management: Develop a combined nutrition and exercise program designed to reach desired caloric intake, while  maintaining appropriate intake of nutrient and fiber, sodium and fats, and appropriate energy expenditure required for the weight goal.   Admit Weight 201 lb 15.1 oz (91.6 kg)   Expected Outcomes Short Term: Continue to assess and modify interventions until short term weight is achieved;Long Term: Adherence to nutrition and physical activity/exercise program aimed toward attainment of established weight goal;Weight Loss: Understanding of general recommendations for a balanced deficit meal plan, which promotes 1-2 lb weight loss per week and includes a negative energy balance of [phone] kcal/d   Increase Strength and Stamina Yes   Intervention Provide advice, education, support and counseling about physical activity/exercise needs.;Develop an individualized exercise prescription for aerobic and resistive training based on initial evaluation findings, risk stratification, comorbidities and participant's personal goals.   Expected Outcomes Achievement of increased cardiorespiratory fitness and enhanced flexibility, muscular endurance and strength shown through measurements of functional capacity and personal statement of participant.   Lipids Yes   Intervention Provide education and support for participant on nutrition & aerobic/resistive exercise along with prescribed medications to achieve LDL 70mg , HDL >40mg .   Expected Outcomes Short Term: Participant states understanding of desired cholesterol values and is compliant with medications prescribed. Participant is following exercise prescription and nutrition guidelines.;Long Term: Cholesterol controlled with medications as prescribed, with individualized exercise RX and with personalized nutrition plan. Value goals: LDL < 70mg , HDL > 40 mg.      Core Components/Risk Factors/Patient Goals Review:      Goals and Risk Factor Review    Row Name 10/22/15 1716 11/19/15 1037           Core Components/Risk Factors/Patient Goals Review   Personal Goals  Review Other Increase Strength and Stamina      Review Pt has started HEP and notice some weakness in legs. Will adjust program to cater to  strengthening the  lower extremity  "strength is coming slowly" have noticed that leg muscles are getting stronger      Expected Outcomes Pt will have strength in legs and be able to continue with CR and HEP Pt legs are getting stronger will continue to progress in strength and cardiovascular endurance         Core Components/Risk Factors/Patient Goals at Discharge (Final Review):      Goals and Risk Factor Review - 11/19/15 1037      Core Components/Risk Factors/Patient Goals Review   Personal Goals Review Increase Strength and Stamina   Review "strength is coming slowly" have noticed that leg muscles are getting stronger   Expected Outcomes Pt legs are getting stronger will continue to progress in strength and cardiovascular endurance      ITP Comments:     ITP Comments    Row Name 10/02/15 1602 11/16/15 0809         ITP Comments Dr. Fransico Him, Medical Director  attended Hypertension education video/lecture;  meets goals/outcomes         Comments: Pt is making expected progress toward personal goals after completing 22 sessions. Recommend continued exercise and life style modification education including  stress management and relaxation techniques to decrease cardiac risk profile.

## 2015-11-30 ENCOUNTER — Ambulatory Visit (HOSPITAL_COMMUNITY): Payer: Medicare HMO

## 2015-11-30 ENCOUNTER — Encounter (HOSPITAL_COMMUNITY): Payer: Medicare HMO

## 2015-12-03 ENCOUNTER — Ambulatory Visit (HOSPITAL_COMMUNITY): Payer: Medicare HMO

## 2015-12-03 ENCOUNTER — Encounter (HOSPITAL_COMMUNITY)
Admission: RE | Admit: 2015-12-03 | Discharge: 2015-12-03 | Disposition: A | Discharge status: Home / Self Care | Payer: Medicare HMO | Source: Ambulatory Visit | Attending: Cardiovascular Disease | Admitting: Cardiovascular Disease

## 2015-12-03 DIAGNOSIS — I213 ST elevation (STEMI) myocardial infarction of unspecified site: Secondary | ICD-10-CM | POA: Diagnosis not present

## 2015-12-03 DIAGNOSIS — Z955 Presence of coronary angioplasty implant and graft: Secondary | ICD-10-CM

## 2015-12-05 ENCOUNTER — Encounter (HOSPITAL_COMMUNITY)
Admission: RE | Admit: 2015-12-05 | Discharge: 2015-12-05 | Disposition: A | Discharge status: Home / Self Care | Payer: Medicare HMO | Source: Ambulatory Visit | Attending: Cardiovascular Disease | Admitting: Cardiovascular Disease

## 2015-12-05 ENCOUNTER — Ambulatory Visit (HOSPITAL_COMMUNITY): Payer: Medicare HMO

## 2015-12-05 DIAGNOSIS — I213 ST elevation (STEMI) myocardial infarction of unspecified site: Secondary | ICD-10-CM

## 2015-12-05 DIAGNOSIS — Z955 Presence of coronary angioplasty implant and graft: Secondary | ICD-10-CM

## 2015-12-07 ENCOUNTER — Ambulatory Visit (HOSPITAL_COMMUNITY): Payer: Medicare HMO

## 2015-12-07 ENCOUNTER — Encounter (HOSPITAL_COMMUNITY)
Admission: RE | Admit: 2015-12-07 | Discharge: 2015-12-07 | Disposition: A | Discharge status: Home / Self Care | Payer: Medicare HMO | Source: Ambulatory Visit | Attending: Cardiovascular Disease | Admitting: Cardiovascular Disease

## 2015-12-07 DIAGNOSIS — I213 ST elevation (STEMI) myocardial infarction of unspecified site: Secondary | ICD-10-CM | POA: Diagnosis not present

## 2015-12-07 DIAGNOSIS — Z955 Presence of coronary angioplasty implant and graft: Secondary | ICD-10-CM

## 2015-12-10 ENCOUNTER — Ambulatory Visit (HOSPITAL_COMMUNITY): Payer: Medicare HMO

## 2015-12-10 ENCOUNTER — Encounter (HOSPITAL_COMMUNITY)
Admission: RE | Admit: 2015-12-10 | Discharge: 2015-12-10 | Disposition: A | Discharge status: Home / Self Care | Payer: Medicare HMO | Source: Ambulatory Visit | Attending: Cardiovascular Disease | Admitting: Cardiovascular Disease

## 2015-12-10 DIAGNOSIS — Z955 Presence of coronary angioplasty implant and graft: Secondary | ICD-10-CM

## 2015-12-10 DIAGNOSIS — I213 ST elevation (STEMI) myocardial infarction of unspecified site: Secondary | ICD-10-CM | POA: Diagnosis not present

## 2015-12-12 ENCOUNTER — Ambulatory Visit (HOSPITAL_COMMUNITY): Payer: Medicare HMO

## 2015-12-12 ENCOUNTER — Encounter (HOSPITAL_COMMUNITY)
Admission: RE | Admit: 2015-12-12 | Discharge: 2015-12-12 | Disposition: A | Discharge status: Home / Self Care | Payer: Medicare HMO | Source: Ambulatory Visit | Attending: Cardiovascular Disease | Admitting: Cardiovascular Disease

## 2015-12-12 DIAGNOSIS — I213 ST elevation (STEMI) myocardial infarction of unspecified site: Secondary | ICD-10-CM

## 2015-12-12 DIAGNOSIS — Z955 Presence of coronary angioplasty implant and graft: Secondary | ICD-10-CM

## 2015-12-14 ENCOUNTER — Ambulatory Visit (HOSPITAL_COMMUNITY): Payer: Medicare HMO

## 2015-12-14 ENCOUNTER — Encounter (HOSPITAL_COMMUNITY)
Admission: RE | Admit: 2015-12-14 | Discharge: 2015-12-14 | Disposition: A | Discharge status: Home / Self Care | Payer: Medicare HMO | Source: Ambulatory Visit | Attending: Cardiovascular Disease | Admitting: Cardiovascular Disease

## 2015-12-14 DIAGNOSIS — Z7982 Long term (current) use of aspirin: Secondary | ICD-10-CM | POA: Insufficient documentation

## 2015-12-14 DIAGNOSIS — Z955 Presence of coronary angioplasty implant and graft: Secondary | ICD-10-CM | POA: Insufficient documentation

## 2015-12-14 DIAGNOSIS — Z87891 Personal history of nicotine dependence: Secondary | ICD-10-CM | POA: Diagnosis not present

## 2015-12-14 DIAGNOSIS — Z79899 Other long term (current) drug therapy: Secondary | ICD-10-CM | POA: Diagnosis not present

## 2015-12-14 DIAGNOSIS — I213 ST elevation (STEMI) myocardial infarction of unspecified site: Secondary | ICD-10-CM | POA: Diagnosis not present

## 2015-12-17 ENCOUNTER — Ambulatory Visit (HOSPITAL_COMMUNITY): Payer: Medicare HMO

## 2015-12-19 ENCOUNTER — Ambulatory Visit (HOSPITAL_COMMUNITY): Payer: Medicare HMO

## 2015-12-19 ENCOUNTER — Encounter (HOSPITAL_COMMUNITY)
Admission: RE | Admit: 2015-12-19 | Discharge: 2015-12-19 | Disposition: A | Discharge status: Home / Self Care | Payer: Medicare HMO | Source: Ambulatory Visit | Attending: Cardiovascular Disease | Admitting: Cardiovascular Disease

## 2015-12-19 DIAGNOSIS — I213 ST elevation (STEMI) myocardial infarction of unspecified site: Secondary | ICD-10-CM | POA: Diagnosis not present

## 2015-12-19 DIAGNOSIS — Z955 Presence of coronary angioplasty implant and graft: Secondary | ICD-10-CM

## 2015-12-21 ENCOUNTER — Ambulatory Visit (HOSPITAL_COMMUNITY): Payer: Medicare HMO

## 2015-12-21 ENCOUNTER — Encounter (HOSPITAL_COMMUNITY)
Admission: RE | Admit: 2015-12-21 | Discharge: 2015-12-21 | Disposition: A | Discharge status: Home / Self Care | Payer: Medicare HMO | Source: Ambulatory Visit | Attending: Cardiovascular Disease | Admitting: Cardiovascular Disease

## 2015-12-21 DIAGNOSIS — Z955 Presence of coronary angioplasty implant and graft: Secondary | ICD-10-CM

## 2015-12-21 DIAGNOSIS — I213 ST elevation (STEMI) myocardial infarction of unspecified site: Secondary | ICD-10-CM

## 2015-12-24 ENCOUNTER — Ambulatory Visit (HOSPITAL_COMMUNITY): Payer: Medicare HMO

## 2015-12-24 ENCOUNTER — Encounter (HOSPITAL_COMMUNITY)
Admission: RE | Admit: 2015-12-24 | Discharge: 2015-12-24 | Disposition: A | Discharge status: Home / Self Care | Payer: Medicare HMO | Source: Ambulatory Visit | Attending: Cardiovascular Disease | Admitting: Cardiovascular Disease

## 2015-12-24 DIAGNOSIS — I213 ST elevation (STEMI) myocardial infarction of unspecified site: Secondary | ICD-10-CM | POA: Diagnosis not present

## 2015-12-24 DIAGNOSIS — Z955 Presence of coronary angioplasty implant and graft: Secondary | ICD-10-CM

## 2015-12-25 NOTE — Progress Notes (Signed)
Cardiac Individual Treatment Plan  Patient Details  Name: Lance Hancock MRN: [id number] Date of Birth: [birth year] Referring Provider:   Flowsheet Row CARDIAC REHAB PHASE II ORIENTATION from 10/02/2015 in [facility]  Referring Provider  [name] MD      Initial Encounter Date:  [place] PHASE II ORIENTATION from 10/02/2015 in [facility]  Date  10/02/15  Referring Provider  [name] MD      Visit Diagnosis: ST elevation myocardial infarction (STEMI), unspecified artery ([place])  Stented coronary artery  Patient's Home Medications on Admission:  Current Outpatient Prescriptions:  .  aspirin 81 MG tablet, Take 81 mg by mouth daily., Disp: , Rfl:  .  atorvastatin (LIPITOR) 80 MG tablet, Take 1 tablet (80 mg total) by mouth daily at 6 PM., Disp: 30 tablet, Rfl: 12 .  co-enzyme Q-10 50 MG capsule, Take 100 mg by mouth 2 (two) times daily. , Disp: , Rfl:  .  GLUCOSAMINE-CHONDROITIN PO, Take 1,200 mg by mouth daily., Disp: , Rfl:  .  metoprolol tartrate (LOPRESSOR) 25 MG tablet, Take 0.5 tablets (12.5 mg total) by mouth 2 (two) times daily., Disp: 15 tablet, Rfl: 12 .  Multiple Vitamin (MULTIVITAMIN) tablet, Take 1 tablet by mouth daily. Takes Reliv vitamin And herbs, Disp: , Rfl:  .  nitroGLYCERIN (NITROSTAT) 0.4 MG SL tablet, Place 1 tablet (0.4 mg total) under the tongue every 5 (five) minutes x 3 doses as needed for chest pain., Disp: 25 tablet, Rfl: 1 .  ticagrelor (BRILINTA) 90 MG TABS tablet, Take 1 tablet (90 mg total) by mouth 2 (two) times daily., Disp: 60 tablet, Rfl: 12  Past Medical History: Past Medical History:  Diagnosis Date  . CAD (coronary artery disease)    a. STEMI 08/2015 3.5x33m Promus DES to prox RCA, 3.0x284mPromus DES to prox RCA.   . [place] Kitchen[place] of echocardiogram    a. Echo 5/17: EF 55-60%, normal wall motion, normal diastolic function, MAC, moderate LAE  . ST elevation  (STEMI) myocardial [place] involving right coronary artery [place]    Tobacco Use: History  Smoking Status  . Former Smoker  . Packs/day: 2.00  . Years: 18.00  . Types: Cigarettes  . Quit date: 01/19/1976  Smokeless Tobacco  . Not on file    Labs: Recent Review Flowsheet Data    Labs for ITP Cardiac and Pulmonary Rehab Latest Ref Rng & Units 08/15/2015   Cholestrol 0 - 200 mg/dL 138   LDLCALC 0 - 99 mg/dL 90   HDL >40 mg/dL 43   Trlycerides <150 mg/dL 26   Hemoglobin A1c 4.8 - 5.6 % 6.1(H)   TCO2 0 - 100 mmol/L 20      Capillary Blood Glucose: No results found for: GLUCAP   Exercise Target Goals:    Exercise Program Goal: Individual exercise prescription set with THRR, safety & activity barriers. Participant demonstrates ability to understand and report RPE using BORG scale, to self-measure pulse accurately, and to acknowledge the importance of the exercise prescription.  Exercise Prescription Goal: Starting with aerobic activity 30 plus minutes a day, 3 days per week for initial exercise prescription. Provide home exercise prescription and guidelines that participant acknowledges understanding prior to discharge.  Activity Barriers & Risk Stratification:     Activity Barriers & Cardiac Risk Stratification - 10/02/15 1409      Activity Barriers & Cardiac Risk Stratification   Activity Barriers None   Cardiac Risk Stratification  High      6 Minute Walk:     6 Minute Walk    Row Name 10/02/15 1354         6 Minute Walk   Phase Initial     Distance 1765 feet     Walk Time 6 minutes     # of Rest Breaks 0     MPH 3.34     METS 3.5     RPE 14     VO2 Peak 12.24     Symptoms Yes (comment)     Comments Leg fatigue, relieved with rest.     Resting HR 61 bpm     Resting BP 122/60     Max Ex. HR 97 bpm     Max Ex. BP 162/72     2 Minute Post BP 128/70        Initial Exercise Prescription:     Initial Exercise Prescription - 10/03/15 0800       Date of Initial Exercise RX and Referring Provider   Date 10/02/15   Referring Provider [name] MD     Treadmill   MPH 2.3   Grade 1   Minutes 10   METs 3.08     Bike   Level 1.2   Minutes 10   METs 3.5     NuStep   Level 3   Minutes 10   METs 2.5     Prescription Details   Frequency (times per week) 3   Duration Progress to 30 minutes of continuous aerobic without signs/symptoms of physical distress     Intensity   THRR 40-80% of Max Heartrate 58-117   Ratings of Perceived Exertion 11-13   Perceived Dyspnea 0-4     Progression   Progression Continue to progress workloads to maintain intensity without signs/symptoms of physical distress.     Resistance Training   Training Prescription Yes   Weight 2 lbs.   Reps 10-12      Perform Capillary Blood Glucose checks as needed.  Exercise Prescription Changes:      Exercise Prescription Changes    Row Name 10/15/15 1100 10/19/15 1100 10/22/15 1700 10/31/15 1600 11/19/15 1000     Exercise Review   Progression Yes  - Yes Yes Yes     Response to Exercise   Blood Pressure (Admit) 120/60  - 116/62 116/64 110/60   Blood Pressure (Exercise) 132/72  - 140/74 124/80 140/74   Blood Pressure (Exit) 110/62  - 116/62 118/64 124/70   Heart Rate (Admit) 59 bpm  - 57 bpm 69 bpm 57 bpm   Heart Rate (Exercise) 88 bpm  - 96 bpm 92 bpm 96 bpm   Heart Rate (Exit) 55 bpm  - 56 bpm 61 bpm 56 bpm   Rating of Perceived Exertion (Exercise) 11  - 12 12 11    Comments  - reviewed HEP on 7/717 reviewed HEP on 7/717 reviewed HEP on 7/717 reviewed HEP on 7/717   Duration Progress to 30 minutes of continuous aerobic without signs/symptoms of physical distress  - Progress to 30 minutes of continuous aerobic without signs/symptoms of physical distress Progress to 30 minutes of continuous aerobic without signs/symptoms of physical distress Progress to 30 minutes of continuous aerobic without signs/symptoms of physical distress    Intensity THRR unchanged  - THRR unchanged THRR unchanged THRR unchanged     Progression   Progression Continue to progress workloads to maintain intensity without signs/symptoms of physical distress.  -  Continue to progress workloads to maintain intensity without signs/symptoms of physical distress. Continue to progress workloads to maintain intensity without signs/symptoms of physical distress. Continue to progress workloads to maintain intensity without signs/symptoms of physical distress.   Average METs 3.4  - 3.7 3.7 4.5     Resistance Training   Training Prescription Yes  - Yes Yes Yes   Weight 3lbs  - 3lbs 3lbs 4lbs   Reps 10-12  - 10-12 10-12 10-12     Interval Training   Interval Training No  -  -  -  -     Treadmill   MPH 2.3  - 2.8 2.8 3.3   Grade 1  - 2 2 3    Minutes 10  - 10 10 10    METs 3.08  - 3.91 3.91 4.89     Bike   Level 1.2  - 1.5 1.8 1.8   Minutes 10  - 10 10 10    METs 3.5  - 4.15 4.79 4.82     NuStep   Level 3  - 3 4 5    Minutes 10  - 10 10 10    METs 3.4  - 3.2 3.5 4.2     Home Exercise Plan   Plans to continue exercise at  - Home  reviewed on 7/717 please read progress note for details Home  reviewed on 7/717 please read progress note for details Home  reviewed on 7/717 please read progress note for details Home  reviewed on 7/717 please read progress note for details   Frequency  - Add 2 additional days to program exercise sessions. Add 2 additional days to program exercise sessions. Add 2 additional days to program exercise sessions. Add 2 additional days to program exercise sessions.   Row Name 12/05/15 1100 12/21/15 1000           Exercise Review   Progression Yes Yes        Response to Exercise   Blood Pressure (Admit) 120/70 110/66      Blood Pressure (Exercise) 144/78 138/78      Blood Pressure (Exit) 104/60 102/70      Heart Rate (Admit) 59 bpm 55 bpm      Heart Rate (Exercise) 100 bpm 106 bpm      Heart Rate (Exit) 59 bpm 66 bpm       Rating of Perceived Exertion (Exercise) 12 12      Comments reviewed HEP on 7/717 reviewed HEP on 7/717      Duration Progress to 30 minutes of continuous aerobic without signs/symptoms of physical distress Progress to 30 minutes of continuous aerobic without signs/symptoms of physical distress      Intensity THRR unchanged THRR unchanged        Progression   Progression Continue to progress workloads to maintain intensity without signs/symptoms of physical distress. Continue to progress workloads to maintain intensity without signs/symptoms of physical distress.      Average METs 5.1 5.7        Resistance Training   Training Prescription Yes Yes      Weight 5lbs 5lbs      Reps 10-12 10-12        Treadmill   MPH 3.3 3.6      Grade 3 5      Minutes 10 10      METs 4.89 5.28        Bike   Level 2 2      Minutes 10 10  METs 5.26 5.26        NuStep   Level 5 5      Minutes 10 10      METs 5.7 5.5        Home Exercise Plan   Plans to continue exercise at Home  reviewed on 7/717 please read progress note for details Home  reviewed on 7/717 please read progress note for details      Frequency Add 2 additional days to program exercise sessions. Add 2 additional days to program exercise sessions.         Exercise Comments:      Exercise Comments    Row Name 10/22/15 1718 11/19/15 1037 12/05/15 1134 12/21/15 1004     Exercise Comments Reviewed METs and goals. Pt is tolerating exercise well; minus the lower leg pain from time to time. Will continue to monitor exericise progression and lower leg weakness. Reviewed METs and goals. Pt is tolerating exercise well; minus the lower leg pain from time to time. Will continue to monitor exericise progression and lower leg weakness. Reviewed METs. Pt is tolerating exercise well; minus the lower leg pain from time to time. Will continue to monitor exericise progression and lower leg weakness. Reviewed METS and goals. Pt is tolerating  exercises well; will continue to monitor exercise progression.       Discharge Exercise Prescription (Final Exercise Prescription Changes):     Exercise Prescription Changes - 12/21/15 1000      Exercise Review   Progression Yes     Response to Exercise   Blood Pressure (Admit) 110/66   Blood Pressure (Exercise) 138/78   Blood Pressure (Exit) 102/70   Heart Rate (Admit) 55 bpm   Heart Rate (Exercise) 106 bpm   Heart Rate (Exit) 66 bpm   Rating of Perceived Exertion (Exercise) 12   Comments reviewed HEP on 7/717   Duration Progress to 30 minutes of continuous aerobic without signs/symptoms of physical distress   Intensity THRR unchanged     Progression   Progression Continue to progress workloads to maintain intensity without signs/symptoms of physical distress.   Average METs 5.7     Resistance Training   Training Prescription Yes   Weight 5lbs   Reps 10-12     Treadmill   MPH 3.6   Grade 5   Minutes 10   METs 5.28     Bike   Level 2   Minutes 10   METs 5.26     NuStep   Level 5   Minutes 10   METs 5.5     Home Exercise Plan   Plans to continue exercise at Home  reviewed on 7/717 please read progress note for details   Frequency Add 2 additional days to program exercise sessions.      Nutrition:  Target Goals: Understanding of nutrition guidelines, daily intake of sodium 1500mg , cholesterol 200mg , calories 30% from fat and 7% or less from saturated fats, daily to have 5 or more servings of fruits and vegetables.  Biometrics:     Pre Biometrics - 10/02/15 1354      Pre Biometrics   Height 5' 7.75" (1.721 m)   Weight 201 lb 15.1 oz (91.6 kg)   Waist Circumference 40 inches   Hip Circumference 42.75 inches   Waist to Hip Ratio 0.94 %   BMI (Calculated) 31   Triceps Skinfold 18 mm   % Body Fat 29.7 %   Grip Strength 40.5 kg   Flexibility  17 in   Single Leg Stand 5.75 seconds       Nutrition Therapy Plan and Nutrition Goals:   Nutrition  Discharge: Nutrition Scores:     Nutrition Assessments - 10/22/15 1521      MEDFICTS Scores   Pre Score 40      Nutrition Goals Re-Evaluation:   Psychosocial: Target Goals: Acknowledge presence or absence of depression, maximize coping skills, provide positive support system. Participant is able to verbalize types and ability to use techniques and skills needed for reducing stress and depression.  Initial Review & Psychosocial Screening:     Initial Psych Review & Screening - 10/02/15 [place]? Yes     Barriers   Psychosocial barriers to participate in program The patient should benefit from training in stress management and relaxation.     Screening Interventions   Interventions Encouraged to exercise      Quality of Life Scores:     Quality of Life - 10/02/15 1354      Quality of Life Scores   Health/Function Pre 27.03 %   Socioeconomic Pre 25.57 %   Psych/Spiritual Pre 28.29 %   Family Pre 27.6 %   GLOBAL Pre 27.07 %      PHQ-9: Recent Review Flowsheet Data    Depression screen [facility] 2/9 10/08/2015   Decreased Interest 0   Down, Depressed, Hopeless 0   PHQ - 2 Score 0      Psychosocial Evaluation and Intervention:   Psychosocial Re-Evaluation:     Psychosocial Re-Evaluation    Row Name 10/24/15 1702 11/28/15 1703 12/25/15 1306         Psychosocial Re-Evaluation   Interventions Encouraged to attend Cardiac Rehabilitation for the exercise;Stress management education;Relaxation education Stress management education;Relaxation education;Encouraged to attend Cardiac Rehabilitation for the exercise  -     Comments pt is planning to walk on his own at home.  He is presently not able to walk outside due to humidity.  pt admits he is not motivated enough to drive to indoor facility to exercise on off days. pt has treadmill at his home and plans to begin using this. pt does exhibit stress and anxiety howver this is  improviing.  pt health related anxiety is decreasing. pt reports he does feel like he is getting stronger however still not as commited to exercising on his own.  pt is looking forward to his beach vacation and plans to walk the beach frequently health related anxiety significantly decreased.  pt is exercising on his own a little more.  pt is encouraged because he has less dyspnea with stair climbing.       Continued Psychosocial Services Needed Yes Yes No  since health related anxiety decreased no futher psychosocial intervention necessary         Vocational Rehabilitation: Provide vocational rehab assistance to qualifying candidates.   Vocational Rehab Evaluation & Intervention:     Vocational Rehab - 10/02/15 1604      Initial Vocational Rehab Evaluation & Intervention   Assessment shows need for Vocational Rehabilitation No      Education: Education Goals: Education classes will be provided on a weekly basis, covering required topics. Participant will state understanding/return demonstration of topics presented.  Learning Barriers/Preferences:     Learning Barriers/Preferences - 10/02/15 1604      Learning Barriers/Preferences   Learning Barriers Sight   Learning Preferences Audio;Verbal Instruction;Video;Skilled Demonstration;Written Material  Education Topics: Count Your Pulse:  -Group instruction provided by verbal instruction, demonstration, patient participation and written materials to support subject.  Instructors address importance of being able to find your pulse and how to count your pulse when at home without a heart monitor.  Patients get hands on experience counting their pulse with staff help and individually. Flowsheet Row CARDIAC REHAB PHASE II EXERCISE from 12/26/2015 in [facility]  Date  12/14/15  Educator  Maurice Small, RN  Instruction Review Code  R- Review/reinforce      Heart Attack, Angina, and Risk Factor  Modification:  -Group instruction provided by verbal instruction, video, and written materials to support subject.  Instructors address signs and symptoms of angina and heart attacks.    Also discuss risk factors for heart disease and how to make changes to improve heart health risk factors. Flowsheet Row CARDIAC REHAB PHASE II EXERCISE from 12/26/2015 in [facility]  Date  12/05/15  Instruction Review Code  2- meets goals/outcomes      Functional Fitness:  -Group instruction provided by verbal instruction, demonstration, patient participation, and written materials to support subject.  Instructors address safety measures for doing things around the house.  Discuss how to get up and down off the floor, how to pick things up properly, how to safely get out of a chair without assistance, and balance training. Flowsheet Row CARDIAC REHAB PHASE II EXERCISE from 12/26/2015 in [facility]  Date  11/02/15  Instruction Review Code  2- meets goals/outcomes      Meditation and Mindfulness:  -Group instruction provided by verbal instruction, patient participation, and written materials to support subject.  Instructor addresses importance of mindfulness and meditation practice to help reduce stress and improve awareness.  Instructor also leads participants through a meditation exercise.  Flowsheet Row CARDIAC REHAB PHASE II EXERCISE from 12/26/2015 in [facility]  Date  10/17/15  Instruction Review Code  2- meets goals/outcomes      Stretching for Flexibility and Mobility:  -Group instruction provided by verbal instruction, patient participation, and written materials to support subject.  Instructors lead participants through series of stretches that are designed to increase flexibility thus improving mobility.  These stretches are additional exercise for major muscle groups that are typically performed during  regular warm up and cool down. Flowsheet Row CARDIAC REHAB PHASE II EXERCISE from 12/26/2015 in [facility]  Date  12/07/15  Instruction Review Code  2- meets goals/outcomes      Hands Only CPR Anytime:  -Group instruction provided by verbal instruction, video, patient participation and written materials to support subject.  Instructors co-teach with [facility] video for hands only CPR.  Participants get hands on experience with mannequins.   Nutrition I class: Heart Healthy Eating:  -Group instruction provided by PowerPoint slides, verbal discussion, and written materials to support subject matter. The instructor gives an explanation and review of the Therapeutic Lifestyle Changes diet recommendations, which includes a discussion on lipid goals, dietary fat, sodium, fiber, plant stanol/sterol esters, sugar, and the components of a well-balanced, healthy diet. Flowsheet Row CARDIAC REHAB PHASE II EXERCISE from 12/26/2015 in [facility]  Date  11/21/15  Educator  RD  Instruction Review Code  Not applicable [class handouts given]      Nutrition II class: Lifestyle Skills:  -Group instruction provided by PowerPoint slides, verbal discussion, and written materials  to support subject matter. The instructor gives an explanation and review of label reading, grocery shopping for heart health, heart healthy recipe modifications, and ways to make healthier choices when eating out. Flowsheet Row CARDIAC REHAB PHASE II EXERCISE from 12/26/2015 in [place]  Date  11/21/15  Educator  RD  Instruction Review Code  Not applicable [class handouts given]      Diabetes Question & Answer:  -Group instruction provided by PowerPoint slides, verbal discussion, and written materials to support subject matter. The instructor gives an explanation and review of diabetes co-morbidities, pre- and post-prandial blood glucose goals,  pre-exercise blood glucose goals, signs, symptoms, and treatment of hypoglycemia and hyperglycemia, and foot care basics. Flowsheet Row CARDIAC REHAB PHASE II EXERCISE from 12/26/2015 in [facility]  Date  12/21/15  Educator  RD  Instruction Review Code  2- meets goals/outcomes      Diabetes Blitz:  -Group instruction provided by PowerPoint slides, verbal discussion, and written materials to support subject matter. The instructor gives an explanation and review of the physiology behind type 1 and type 2 diabetes, diabetes medications and rational behind using different medications, pre- and post-prandial blood glucose recommendations and Hemoglobin A1c goals, diabetes diet, and exercise including blood glucose guidelines for exercising safely.    Portion Distortion:  -Group instruction provided by PowerPoint slides, verbal discussion, written materials, and food models to support subject matter. The instructor gives an explanation of serving size versus portion size, changes in portions sizes over the last 20 years, and what consists of a serving from each food group. Flowsheet Row CARDIAC REHAB PHASE II EXERCISE from 12/26/2015 in [facility]  Date  11/21/15  Educator  RD  Instruction Review Code  2- meets goals/outcomes      Stress Management:  -Group instruction provided by verbal instruction, video, and written materials to support subject matter.  Instructors review role of stress in heart disease and how to cope with stress positively.   Flowsheet Row CARDIAC REHAB PHASE II EXERCISE from 12/26/2015 in [place]  Date  11/14/15  Instruction Review Code  2- meets goals/outcomes      Exercising on Your Own:  -Group instruction provided by verbal instruction, power point, and written materials to support subject.  Instructors discuss benefits of exercise, components of exercise, frequency and  intensity of exercise, and end points for exercise.  Also discuss use of nitroglycerin and activating EMS.  Review options of places to exercise outside of rehab.  Review guidelines for sex with heart disease. Flowsheet Row CARDIAC REHAB PHASE II EXERCISE from 12/26/2015 in [place]  Date  11/07/15  Instruction Review Code  2- meets goals/outcomes      Cardiac Drugs I:  -Group instruction provided by verbal instruction and written materials to support subject.  Instructor reviews cardiac drug classes: antiplatelets, anticoagulants, beta blockers, and statins.  Instructor discusses reasons, side effects, and lifestyle considerations for each drug class.   Cardiac Drugs II:  -Group instruction provided by verbal instruction and written materials to support subject.  Instructor reviews cardiac drug classes: angiotensin converting enzyme inhibitors (ACE-I), angiotensin II receptor blockers (ARBs), nitrates, and calcium channel blockers.  Instructor discusses reasons, side effects, and lifestyle considerations for each drug class. Flowsheet Row CARDIAC REHAB PHASE II EXERCISE from 12/26/2015 in [place]  Date  12/26/15  Instruction Review Code  2- meets goals/outcomes      Anatomy and Physiology of the Circulatory System:  -Group instruction provided by verbal instruction, video, and written materials to support subject.  Reviews functional anatomy of heart, how it relates to various diagnoses, and what role the heart plays in the overall system. Flowsheet Row CARDIAC REHAB PHASE II EXERCISE from 12/26/2015 in [facility]  Date  12/19/15  Instruction Review Code  2- meets goals/outcomes      Knowledge Questionnaire Score:     Knowledge Questionnaire Score - 10/02/15 1354      Knowledge Questionnaire Score   Pre Score 22/24      Core Components/Risk Factors/Patient Goals at Admission:      Personal Goals and Risk Factors at Admission - 10/02/15 1604      Core Components/Risk Factors/Patient Goals on Admission    Weight Management Yes;Weight Loss   Intervention Weight Management: Provide education and appropriate resources to help participant work on and attain dietary goals.;Weight Management: Develop a combined nutrition and exercise program designed to reach desired caloric intake, while maintaining appropriate intake of nutrient and fiber, sodium and fats, and appropriate energy expenditure required for the weight goal.   Admit Weight 201 lb 15.1 oz (91.6 kg)   Expected Outcomes Short Term: Continue to assess and modify interventions until short term weight is achieved;Long Term: Adherence to nutrition and physical activity/exercise program aimed toward attainment of established weight goal;Weight Loss: Understanding of general recommendations for a balanced deficit meal plan, which promotes 1-2 lb weight loss per week and includes a negative energy balance of [phone] kcal/d   Increase Strength and Stamina Yes   Intervention Provide advice, education, support and counseling about physical activity/exercise needs.;Develop an individualized exercise prescription for aerobic and resistive training based on initial evaluation findings, risk stratification, comorbidities and participant's personal goals.   Expected Outcomes Achievement of increased cardiorespiratory fitness and enhanced flexibility, muscular endurance and strength shown through measurements of functional capacity and personal statement of participant.   Lipids Yes   Intervention Provide education and support for participant on nutrition & aerobic/resistive exercise along with prescribed medications to achieve LDL 70mg , HDL >40mg .   Expected Outcomes Short Term: Participant states understanding of desired cholesterol values and is compliant with medications prescribed. Participant is following exercise prescription and  nutrition guidelines.;Long Term: Cholesterol controlled with medications as prescribed, with individualized exercise RX and with personalized nutrition plan. Value goals: LDL < 70mg , HDL > 40 mg.      Core Components/Risk Factors/Patient Goals Review:      Goals and Risk Factor Review    Row Name 10/22/15 1716 11/19/15 1037 12/19/15 1226         Core Components/Risk Factors/Patient Goals Review   Personal Goals Review Other Increase Strength and Stamina Other     Review Pt has started HEP and notice some weakness in legs. Will adjust program to cater to  strengthening the lower extremity  "strength is coming slowly" have noticed that leg muscles are getting stronger Strength and energy is better     Expected Outcomes Pt will have strength in legs and be able to continue with CR and HEP Pt legs are getting stronger will continue to progress in strength and cardiovascular endurance Pt will continue to feel better        Core Components/Risk Factors/Patient Goals at Discharge (Final Review):      Goals and Risk Factor Review - 12/19/15 1226  Core Components/Risk Factors/Patient Goals Review   Personal Goals Review Other   Review Strength and energy is better   Expected Outcomes Pt will continue to feel better      ITP Comments:     ITP Comments    Row Name 10/02/15 1602 11/16/15 0809         ITP Comments Dr. Fransico Him, Medical Director  attended Hypertension education video/lecture;  meets goals/outcomes         Comments: Pt is making expected progress toward personal goals after completing 31 sessions. Recommend continued exercise and life style modification education including  stress management and relaxation techniques to decrease cardiac risk profile.

## 2015-12-26 ENCOUNTER — Encounter (HOSPITAL_COMMUNITY)
Admission: RE | Admit: 2015-12-26 | Discharge: 2015-12-26 | Disposition: A | Discharge status: Home / Self Care | Payer: Medicare HMO | Source: Ambulatory Visit | Attending: Cardiovascular Disease | Admitting: Cardiovascular Disease

## 2015-12-26 ENCOUNTER — Ambulatory Visit (HOSPITAL_COMMUNITY): Payer: Medicare HMO

## 2015-12-26 DIAGNOSIS — I213 ST elevation (STEMI) myocardial infarction of unspecified site: Secondary | ICD-10-CM | POA: Diagnosis not present

## 2015-12-26 DIAGNOSIS — Z955 Presence of coronary angioplasty implant and graft: Secondary | ICD-10-CM

## 2015-12-28 ENCOUNTER — Ambulatory Visit (HOSPITAL_COMMUNITY): Payer: Medicare HMO

## 2015-12-28 ENCOUNTER — Encounter (HOSPITAL_COMMUNITY)
Admission: RE | Admit: 2015-12-28 | Discharge: 2015-12-28 | Disposition: A | Discharge status: Home / Self Care | Payer: Medicare HMO | Source: Ambulatory Visit | Attending: Cardiovascular Disease | Admitting: Cardiovascular Disease

## 2015-12-28 DIAGNOSIS — I213 ST elevation (STEMI) myocardial infarction of unspecified site: Secondary | ICD-10-CM

## 2015-12-28 DIAGNOSIS — Z955 Presence of coronary angioplasty implant and graft: Secondary | ICD-10-CM

## 2015-12-31 ENCOUNTER — Encounter (HOSPITAL_COMMUNITY)
Admission: RE | Admit: 2015-12-31 | Discharge: 2015-12-31 | Disposition: A | Discharge status: Home / Self Care | Payer: Medicare HMO | Source: Ambulatory Visit | Attending: Cardiovascular Disease | Admitting: Cardiovascular Disease

## 2015-12-31 ENCOUNTER — Ambulatory Visit (HOSPITAL_COMMUNITY): Payer: Medicare HMO

## 2015-12-31 DIAGNOSIS — I213 ST elevation (STEMI) myocardial infarction of unspecified site: Secondary | ICD-10-CM

## 2015-12-31 DIAGNOSIS — Z955 Presence of coronary angioplasty implant and graft: Secondary | ICD-10-CM

## 2016-01-02 ENCOUNTER — Ambulatory Visit (HOSPITAL_COMMUNITY): Payer: Medicare HMO

## 2016-01-02 ENCOUNTER — Encounter (HOSPITAL_COMMUNITY)
Admission: RE | Admit: 2016-01-02 | Discharge: 2016-01-02 | Disposition: A | Discharge status: Home / Self Care | Payer: Medicare HMO | Source: Ambulatory Visit | Attending: Cardiovascular Disease | Admitting: Cardiovascular Disease

## 2016-01-02 DIAGNOSIS — I213 ST elevation (STEMI) myocardial infarction of unspecified site: Secondary | ICD-10-CM | POA: Diagnosis not present

## 2016-01-02 DIAGNOSIS — Z955 Presence of coronary angioplasty implant and graft: Secondary | ICD-10-CM

## 2016-01-04 ENCOUNTER — Ambulatory Visit (HOSPITAL_COMMUNITY): Payer: Medicare HMO

## 2016-01-04 ENCOUNTER — Encounter (HOSPITAL_COMMUNITY): Payer: Self-pay

## 2016-01-04 ENCOUNTER — Encounter (HOSPITAL_COMMUNITY)
Admission: RE | Admit: 2016-01-04 | Discharge: 2016-01-04 | Disposition: A | Discharge status: Home / Self Care | Payer: Medicare HMO | Source: Ambulatory Visit | Attending: Cardiovascular Disease | Admitting: Cardiovascular Disease

## 2016-01-04 VITALS — Wt 194.9 lb

## 2016-01-04 DIAGNOSIS — I213 ST elevation (STEMI) myocardial infarction of unspecified site: Secondary | ICD-10-CM

## 2016-01-04 DIAGNOSIS — Z955 Presence of coronary angioplasty implant and graft: Secondary | ICD-10-CM

## 2016-01-04 NOTE — Progress Notes (Signed)
Cardiac Individual Treatment Plan  Patient Details  Name: Lance Hancock MRN: [id number] Date of Birth: [birth year] Referring Provider:   Flowsheet Row CARDIAC REHAB PHASE II ORIENTATION from 10/02/2015 in [place] [facility]  Referring Provider  [name] MD      Initial Encounter Date:  [place] PHASE II ORIENTATION from 10/02/2015 in [facility]  Date  10/02/15  Referring Provider  [name] MD      Visit Diagnosis: ST elevation myocardial infarction (STEMI), unspecified artery ([place])  Stented coronary artery  Patient's Home Medications on Admission:  Current Outpatient Prescriptions:  .  aspirin 81 MG tablet, Take 81 mg by mouth daily., Disp: , Rfl:  .  atorvastatin (LIPITOR) 80 MG tablet, Take 1 tablet (80 mg total) by mouth daily at 6 PM., Disp: 30 tablet, Rfl: 12 .  co-enzyme Q-10 50 MG capsule, Take 100 mg by mouth 2 (two) times daily. , Disp: , Rfl:  .  GLUCOSAMINE-CHONDROITIN PO, Take 1,200 mg by mouth daily., Disp: , Rfl:  .  metoprolol tartrate (LOPRESSOR) 25 MG tablet, Take 0.5 tablets (12.5 mg total) by mouth 2 (two) times daily., Disp: 15 tablet, Rfl: 12 .  Multiple Vitamin (MULTIVITAMIN) tablet, Take 1 tablet by mouth daily. Takes Reliv vitamin And herbs, Disp: , Rfl:  .  nitroGLYCERIN (NITROSTAT) 0.4 MG SL tablet, Place 1 tablet (0.4 mg total) under the tongue every 5 (five) minutes x 3 doses as needed for chest pain., Disp: 25 tablet, Rfl: 1 .  ticagrelor (BRILINTA) 90 MG TABS tablet, Take 1 tablet (90 mg total) by mouth 2 (two) times daily., Disp: 60 tablet, Rfl: 12  Past Medical History: Past Medical History:  Diagnosis Date  . CAD (coronary artery disease)    a. STEMI 08/2015 3.5x33m Promus DES to prox RCA, 3.0x284mPromus DES to prox RCA.   . [place] of echocardiogram    a. Echo 5/17: EF 55-60%, normal wall motion, normal diastolic function, MAC, moderate LAE  . ST elevation  (STEMI) myocardial [place] involving right [place]    Tobacco Use: History  Smoking Status  . Former Smoker  . Packs/day: 2.00  . Years: 18.00  . Types: Cigarettes  . Quit date: 01/19/1976  Smokeless Tobacco  . Not on file    Labs: Recent Review Flowsheet Data    Labs for ITP Cardiac and Pulmonary Rehab Latest Ref Rng & Units 08/15/2015   Cholestrol 0 - 200 mg/dL 138   LDLCALC 0 - 99 mg/dL 90   HDL >40 mg/dL 43   Trlycerides <150 mg/dL 26   Hemoglobin A1c 4.8 - 5.6 % 6.1(H)   TCO2 0 - 100 mmol/L 20      Capillary Blood Glucose: No results found for: GLUCAP   Exercise Target Goals:    Exercise Program Goal: Individual exercise prescription set with THRR, safety & activity barriers. Participant demonstrates ability to understand and report RPE using BORG scale, to self-measure pulse accurately, and to acknowledge the importance of the exercise prescription.  Exercise Prescription Goal: Starting with aerobic activity 30 plus minutes a day, 3 days per week for initial exercise prescription. Provide home exercise prescription and guidelines that participant acknowledges understanding prior to discharge.  Activity Barriers & Risk Stratification:     Activity Barriers & Cardiac Risk Stratification - 10/02/15 1409      Activity Barriers & Cardiac Risk Stratification   Activity Barriers None   Cardiac Risk Stratification  High      6 Minute Walk:     6 Minute Walk    Row Name 10/02/15 1354 01/23/16 1106       6 Minute Walk   Phase Initial Discharge    Distance 1765 feet 1818 feet    Walk Time 6 minutes 6 minutes    # of Rest Breaks 0 0    MPH 3.34 3.44    METS 3.5 3.52    RPE 14 10    VO2 Peak 12.24 12.32    Symptoms Yes (comment) No    Comments Leg fatigue, relieved with rest.  -    Resting HR 61 bpm 57 bpm    Resting BP 122/60 134/70    Max Ex. HR 97 bpm 98 bpm    Max Ex. BP 162/72 150/70    2 Minute Post BP 128/70 122/60        Initial Exercise Prescription:     Initial Exercise Prescription - 10/03/15 0800      Date of Initial Exercise RX and Referring Provider   Date 10/02/15   Referring Provider [name] MD     Treadmill   MPH 2.3   Grade 1   Minutes 10   METs 3.08     Bike   Level 1.2   Minutes 10   METs 3.5     NuStep   Level 3   Minutes 10   METs 2.5     Prescription Details   Frequency (times per week) 3   Duration Progress to 30 minutes of continuous aerobic without signs/symptoms of physical distress     Intensity   THRR 40-80% of Max Heartrate 58-117   Ratings of Perceived Exertion 11-13   Perceived Dyspnea 0-4     Progression   Progression Continue to progress workloads to maintain intensity without signs/symptoms of physical distress.     Resistance Training   Training Prescription Yes   Weight 2 lbs.   Reps 10-12      Perform Capillary Blood Glucose checks as needed.  Exercise Prescription Changes:      Exercise Prescription Changes    Row Name 10/15/15 1100 10/19/15 1100 10/22/15 1700 10/31/15 1600 11/19/15 1000     Exercise Review   Progression Yes  - Yes Yes Yes     Response to Exercise   Blood Pressure (Admit) 120/60  - 116/62 116/64 110/60   Blood Pressure (Exercise) 132/72  - 140/74 124/80 140/74   Blood Pressure (Exit) 110/62  - 116/62 118/64 124/70   Heart Rate (Admit) 59 bpm  - 57 bpm 69 bpm 57 bpm   Heart Rate (Exercise) 88 bpm  - 96 bpm 92 bpm 96 bpm   Heart Rate (Exit) 55 bpm  - 56 bpm 61 bpm 56 bpm   Rating of Perceived Exertion (Exercise) 11  - 12 12 11    Comments  - reviewed HEP on 7/717 reviewed HEP on 7/717 reviewed HEP on 7/717 reviewed HEP on 7/717   Duration Progress to 30 minutes of continuous aerobic without signs/symptoms of physical distress  - Progress to 30 minutes of continuous aerobic without signs/symptoms of physical distress Progress to 30 minutes of continuous aerobic without signs/symptoms of physical distress  Progress to 30 minutes of continuous aerobic without signs/symptoms of physical distress   Intensity THRR unchanged  - THRR unchanged THRR unchanged THRR unchanged     Progression   Progression Continue to progress workloads to maintain intensity without signs/symptoms  of physical distress.  - Continue to progress workloads to maintain intensity without signs/symptoms of physical distress. Continue to progress workloads to maintain intensity without signs/symptoms of physical distress. Continue to progress workloads to maintain intensity without signs/symptoms of physical distress.   Average METs 3.4  - 3.7 3.7 4.5     Resistance Training   Training Prescription Yes  - Yes Yes Yes   Weight 3lbs  - 3lbs 3lbs 4lbs   Reps 10-12  - 10-12 10-12 10-12     Interval Training   Interval Training No  -  -  -  -     Treadmill   MPH 2.3  - 2.8 2.8 3.3   Grade 1  - 2 2 3    Minutes 10  - 10 10 10    METs 3.08  - 3.91 3.91 4.89     Bike   Level 1.2  - 1.5 1.8 1.8   Minutes 10  - 10 10 10    METs 3.5  - 4.15 4.79 4.82     NuStep   Level 3  - 3 4 5    Minutes 10  - 10 10 10    METs 3.4  - 3.2 3.5 4.2     Home Exercise Plan   Plans to continue exercise at  - Home  reviewed on 7/717 please read progress note for details Home  reviewed on 7/717 please read progress note for details Home  reviewed on 7/717 please read progress note for details Home  reviewed on 7/717 please read progress note for details   Frequency  - Add 2 additional days to program exercise sessions. Add 2 additional days to program exercise sessions. Add 2 additional days to program exercise sessions. Add 2 additional days to program exercise sessions.   Row Name 12/05/15 1100 12/21/15 1000 01/04/16 1041         Exercise Review   Progression Yes Yes  -       Response to Exercise   Blood Pressure (Admit) 120/70 110/66 130/80     Blood Pressure (Exercise) 144/78 138/78 140/70     Blood Pressure (Exit) 104/60 102/70 124/60      Heart Rate (Admit) 59 bpm 55 bpm 61 bpm     Heart Rate (Exercise) 100 bpm 106 bpm 111 bpm     Heart Rate (Exit) 59 bpm 66 bpm 61 bpm     Rating of Perceived Exertion (Exercise) 12 12 12      Comments reviewed HEP on 7/717 reviewed HEP on 7/717 reviewed HEP on 7/717     Duration Progress to 30 minutes of continuous aerobic without signs/symptoms of physical distress Progress to 30 minutes of continuous aerobic without signs/symptoms of physical distress Progress to 30 minutes of continuous aerobic without signs/symptoms of physical distress     Intensity THRR unchanged THRR unchanged THRR unchanged       Progression   Progression Continue to progress workloads to maintain intensity without signs/symptoms of physical distress. Continue to progress workloads to maintain intensity without signs/symptoms of physical distress. Continue to progress workloads to maintain intensity without signs/symptoms of physical distress.     Average METs 5.1 5.7 5.9       Resistance Training   Training Prescription Yes Yes Yes     Weight 5lbs 5lbs 5lbs     Reps 10-12 10-12 10-12       Treadmill   MPH 3.3 3.6 3.6     Grade 3 5 5  Minutes 10 10 10      METs 4.89 5.28 5.28       Bike   Level 2 2 2      Minutes 10 10 10      METs 5.26 5.26 5.28       NuStep   Level 5 5 5      Minutes 10 10 10      METs 5.7 5.5 6.3       Home Exercise Plan   Plans to continue exercise at Home  reviewed on 7/717 please read progress note for details Home  reviewed on 7/717 please read progress note for details Home     Frequency Add 2 additional days to program exercise sessions. Add 2 additional days to program exercise sessions. Add 2 additional days to program exercise sessions.        Exercise Comments:      Exercise Comments    Row Name 10/22/15 1718 11/19/15 1037 12/05/15 1134 12/21/15 1004 01/23/16 1043   Exercise Comments Reviewed METs and goals. Pt is tolerating exercise well; minus the lower leg pain  from time to time. Will continue to monitor exericise progression and lower leg weakness. Reviewed METs and goals. Pt is tolerating exercise well; minus the lower leg pain from time to time. Will continue to monitor exericise progression and lower leg weakness. Reviewed METs. Pt is tolerating exercise well; minus the lower leg pain from time to time. Will continue to monitor exericise progression and lower leg weakness. Reviewed METS and goals. Pt is tolerating exercises well; will continue to monitor exercise progression. Pt completed 36 sessions of cardiac rehab. Pt plans to continue exercise by walking on treadmill and using stationary bike, 4x/week for 30-45 min      Discharge Exercise Prescription (Final Exercise Prescription Changes):     Exercise Prescription Changes - 01/04/16 1041      Response to Exercise   Blood Pressure (Admit) 130/80   Blood Pressure (Exercise) 140/70   Blood Pressure (Exit) 124/60   Heart Rate (Admit) 61 bpm   Heart Rate (Exercise) 111 bpm   Heart Rate (Exit) 61 bpm   Rating of Perceived Exertion (Exercise) 12   Comments reviewed HEP on 7/717   Duration Progress to 30 minutes of continuous aerobic without signs/symptoms of physical distress   Intensity THRR unchanged     Progression   Progression Continue to progress workloads to maintain intensity without signs/symptoms of physical distress.   Average METs 5.9     Resistance Training   Training Prescription Yes   Weight 5lbs   Reps 10-12     Treadmill   MPH 3.6   Grade 5   Minutes 10   METs 5.28     Bike   Level 2   Minutes 10   METs 5.28     NuStep   Level 5   Minutes 10   METs 6.3     Home Exercise Plan   Plans to continue exercise at Home   Frequency Add 2 additional days to program exercise sessions.      Nutrition:  Target Goals: Understanding of nutrition guidelines, daily intake of sodium <1512m, cholesterol <2092m calories 30% from fat and 7% or less from saturated fats,  daily to have 5 or more servings of fruits and vegetables.  Biometrics:     Pre Biometrics - 10/02/15 1354      Pre Biometrics   Height 5' 7.75" (1.721 m)   Weight 201 lb 15.1 oz (91.6  kg)   Waist Circumference 40 inches   Hip Circumference 42.75 inches   Waist to Hip Ratio 0.94 %   BMI (Calculated) 31   Triceps Skinfold 18 mm   % Body Fat 29.7 %   Grip Strength 40.5 kg   Flexibility 17 in   Single Leg Stand 5.75 seconds         Post Biometrics - 01/23/16 1045       Post  Biometrics   Weight 194 lb 14.2 oz (88.4 kg)   Waist Circumference 39.5 inches   Hip Circumference 42.25 inches   Waist to Hip Ratio 0.93 %   Triceps Skinfold 12 mm   % Body Fat 27.5 %   Grip Strength 41 kg   Flexibility 17 in   Single Leg Stand 30 seconds      Nutrition Therapy Plan and Nutrition Goals:   Nutrition Discharge: Nutrition Scores:     Nutrition Assessments - 01/04/16 1217      MEDFICTS Scores   Pre Score 40   Post Score 18   Score Difference -22      Nutrition Goals Re-Evaluation:   Psychosocial: Target Goals: Acknowledge presence or absence of depression, maximize coping skills, provide positive support system. Participant is able to verbalize types and ability to use techniques and skills needed for reducing stress and depression.  Initial Review & Psychosocial Screening:     Initial Psych Review & Screening - 10/02/15 [place]? Yes     Barriers   Psychosocial barriers to participate in program The patient should benefit from training in stress management and relaxation.     Screening Interventions   Interventions Encouraged to exercise      Quality of Life Scores:     Quality of Life - 01/23/16 1103      Quality of Life Scores   Health/Function Pre 27.03 %   Health/Function Post 28.4 %   Health/Function % Change 5.07 %   Socioeconomic Pre 25.57 %   Socioeconomic Post 24.86 %   Socioeconomic % Change  -2.78 %    Psych/Spiritual Pre 28.29 %   Psych/Spiritual Post 28.29 %   Psych/Spiritual % Change 0 %   Family Pre 27.6 %   Family Post 26.4 %   Family % Change -4.35 %   GLOBAL Pre 27.07 %   GLOBAL Post 26.47 %   GLOBAL % Change -2.22 %      PHQ-9: Recent Review Flowsheet Data    Depression screen [facility] 2/9 01/04/2016 10/08/2015   Decreased Interest 0 0   Down, Depressed, Hopeless 0 0   PHQ - 2 Score 0 0      Psychosocial Evaluation and Intervention:     Psychosocial Evaluation - 01/04/16 0921      Psychosocial Evaluation & Interventions   Comments health related anxiety significantly decreased, pt feels comfortable and confident resuming his normal activities including riding his bicycle for aerobic activity    Continued Psychosocial Services Needed No     Discharge Psychosocial Assessment & Intervention   Comments no psychosocial needs identified, no intervention necessary       Psychosocial Re-Evaluation:     Psychosocial Re-Evaluation    Row Name 10/24/15 1702 11/28/15 1703 12/25/15 1306         Psychosocial Re-Evaluation   Interventions Encouraged to attend Cardiac Rehabilitation for the exercise;Stress management education;Relaxation education Stress management education;Relaxation education;Encouraged to attend Cardiac Rehabilitation for  the exercise  -     Comments pt is planning to walk on his own at home.  He is presently not able to walk outside due to humidity.  pt admits he is not motivated enough to drive to indoor facility to exercise on off days. pt has treadmill at his home and plans to begin using this. pt does exhibit stress and anxiety howver this is improviing.  pt health related anxiety is decreasing. pt reports he does feel like he is getting stronger however still not as commited to exercising on his own.  pt is looking forward to his beach vacation and plans to walk the beach frequently health related anxiety significantly decreased.  pt is exercising on his  own a little more.  pt is encouraged because he has less dyspnea with stair climbing.       Continued Psychosocial Services Needed Yes Yes No  since health related anxiety decreased no futher psychosocial intervention necessary         Vocational Rehabilitation: Provide vocational rehab assistance to qualifying candidates.   Vocational Rehab Evaluation & Intervention:     Vocational Rehab - 10/02/15 1604      Initial Vocational Rehab Evaluation & Intervention   Assessment shows need for Vocational Rehabilitation No      Education: Education Goals: Education classes will be provided on a weekly basis, covering required topics. Participant will state understanding/return demonstration of topics presented.  Learning Barriers/Preferences:     Learning Barriers/Preferences - 10/02/15 1604      Learning Barriers/Preferences   Learning Barriers Sight   Learning Preferences Audio;Verbal Instruction;Video;Skilled Demonstration;Written Material      Education Topics: Count Your Pulse:  -Group instruction provided by verbal instruction, demonstration, patient participation and written materials to support subject.  Instructors address importance of being able to find your pulse and how to count your pulse when at home without a heart monitor.  Patients get hands on experience counting their pulse with staff help and individually. Flowsheet Row CARDIAC REHAB PHASE II EXERCISE from 01/02/2016 in [facility]  Date  12/14/15  Educator  Maurice Small, RN  Instruction Review Code  R- Review/reinforce      Heart Attack, Angina, and Risk Factor Modification:  -Group instruction provided by verbal instruction, video, and written materials to support subject.  Instructors address signs and symptoms of angina and heart attacks.    Also discuss risk factors for heart disease and how to make changes to improve heart health risk factors. Flowsheet Row CARDIAC  REHAB PHASE II EXERCISE from 01/02/2016 in [facility]  Date  12/05/15  Instruction Review Code  2- meets goals/outcomes      Functional Fitness:  -Group instruction provided by verbal instruction, demonstration, patient participation, and written materials to support subject.  Instructors address safety measures for doing things around the house.  Discuss how to get up and down off the floor, how to pick things up properly, how to safely get out of a chair without assistance, and balance training. Flowsheet Row CARDIAC REHAB PHASE II EXERCISE from 01/02/2016 in [place]  Date  12/28/15  Instruction Review Code  2- meets goals/outcomes      Meditation and Mindfulness:  -Group instruction provided by verbal instruction, patient participation, and written materials to support subject.  Instructor addresses importance of mindfulness and meditation practice to help reduce stress and improve awareness.  Instructor also leads participants through  a meditation exercise.  Flowsheet Row CARDIAC REHAB PHASE II EXERCISE from 01/02/2016 in [place] [facility]  Date  10/17/15  Instruction Review Code  2- meets goals/outcomes      Stretching for Flexibility and Mobility:  -Group instruction provided by verbal instruction, patient participation, and written materials to support subject.  Instructors lead participants through series of stretches that are designed to increase flexibility thus improving mobility.  These stretches are additional exercise for major muscle groups that are typically performed during regular warm up and cool down. Flowsheet Row CARDIAC REHAB PHASE II EXERCISE from 01/02/2016 in [place]  Date  12/07/15  Instruction Review Code  2- meets goals/outcomes      Hands Only CPR Anytime:  -Group instruction provided by verbal instruction, video, patient participation  and written materials to support subject.  Instructors co-teach with [facility] video for hands only CPR.  Participants get hands on experience with mannequins.   Nutrition I class: Heart Healthy Eating:  -Group instruction provided by PowerPoint slides, verbal discussion, and written materials to support subject matter. The instructor gives an explanation and review of the Therapeutic Lifestyle Changes diet recommendations, which includes a discussion on lipid goals, dietary fat, sodium, fiber, plant stanol/sterol esters, sugar, and the components of a well-balanced, healthy diet. Flowsheet Row CARDIAC REHAB PHASE II EXERCISE from 01/02/2016 in [place]  Date  11/21/15  Educator  RD  Instruction Review Code  Not applicable [class handouts given]      Nutrition II class: Lifestyle Skills:  -Group instruction provided by PowerPoint slides, verbal discussion, and written materials to support subject matter. The instructor gives an explanation and review of label reading, grocery shopping for heart health, heart healthy recipe modifications, and ways to make healthier choices when eating out. Flowsheet Row CARDIAC REHAB PHASE II EXERCISE from 01/02/2016 in [place]  Date  11/21/15  Educator  RD  Instruction Review Code  Not applicable [class handouts given]      Diabetes Question & Answer:  -Group instruction provided by PowerPoint slides, verbal discussion, and written materials to support subject matter. The instructor gives an explanation and review of diabetes co-morbidities, pre- and post-prandial blood glucose goals, pre-exercise blood glucose goals, signs, symptoms, and treatment of hypoglycemia and hyperglycemia, and foot care basics. Flowsheet Row CARDIAC REHAB PHASE II EXERCISE from 01/02/2016 in [place]  Date  12/21/15  Educator  RD  Instruction Review Code  2- meets goals/outcomes       Diabetes Blitz:  -Group instruction provided by PowerPoint slides, verbal discussion, and written materials to support subject matter. The instructor gives an explanation and review of the physiology behind type 1 and type 2 diabetes, diabetes medications and rational behind using different medications, pre- and post-prandial blood glucose recommendations and Hemoglobin A1c goals, diabetes diet, and exercise including blood glucose guidelines for exercising safely.    Portion Distortion:  -Group instruction provided by PowerPoint slides, verbal discussion, written materials, and food models to support subject matter. The instructor gives an explanation of serving size versus portion size, changes in portions sizes over the last 20 years, and what consists of a serving from each food group. Flowsheet Row CARDIAC REHAB PHASE II EXERCISE from 01/02/2016 in [place]  Date  11/21/15  Educator  RD  Instruction Review Code  2- meets goals/outcomes      Stress  Management:  -Group instruction provided by verbal instruction, video, and written materials to support subject matter.  Instructors review role of stress in heart disease and how to cope with stress positively.   Flowsheet Row CARDIAC REHAB PHASE II EXERCISE from 01/02/2016 in [place]  Date  11/14/15  Instruction Review Code  2- meets goals/outcomes      Exercising on Your Own:  -Group instruction provided by verbal instruction, power point, and written materials to support subject.  Instructors discuss benefits of exercise, components of exercise, frequency and intensity of exercise, and end points for exercise.  Also discuss use of nitroglycerin and activating EMS.  Review options of places to exercise outside of rehab.  Review guidelines for sex with heart disease. Flowsheet Row CARDIAC REHAB PHASE II EXERCISE from 01/02/2016 in [place]   Date  01/02/16  Instruction Review Code  2- meets goals/outcomes      Cardiac Drugs I:  -Group instruction provided by verbal instruction and written materials to support subject.  Instructor reviews cardiac drug classes: antiplatelets, anticoagulants, beta blockers, and statins.  Instructor discusses reasons, side effects, and lifestyle considerations for each drug class.   Cardiac Drugs II:  -Group instruction provided by verbal instruction and written materials to support subject.  Instructor reviews cardiac drug classes: angiotensin converting enzyme inhibitors (ACE-I), angiotensin II receptor blockers (ARBs), nitrates, and calcium channel blockers.  Instructor discusses reasons, side effects, and lifestyle considerations for each drug class. Flowsheet Row CARDIAC REHAB PHASE II EXERCISE from 01/02/2016 in [place]  Date  12/26/15  Instruction Review Code  2- meets goals/outcomes      Anatomy and Physiology of the Circulatory System:  -Group instruction provided by verbal instruction, video, and written materials to support subject.  Reviews functional anatomy of heart, how it relates to various diagnoses, and what role the heart plays in the overall system. Flowsheet Row CARDIAC REHAB PHASE II EXERCISE from 01/02/2016 in [place]  Date  12/19/15  Instruction Review Code  2- meets goals/outcomes      Knowledge Questionnaire Score:     Knowledge Questionnaire Score - 01/04/16 0815      Knowledge Questionnaire Score   Post Score 24/24      Core Components/Risk Factors/Patient Goals at Admission:     Personal Goals and Risk Factors at Admission - 10/02/15 1604      Core Components/Risk Factors/Patient Goals on Admission    Weight Management Yes;Weight Loss   Intervention Weight Management: Provide education and appropriate resources to help participant work on and attain dietary goals.;Weight Management:  Develop a combined nutrition and exercise program designed to reach desired caloric intake, while maintaining appropriate intake of nutrient and fiber, sodium and fats, and appropriate energy expenditure required for the weight goal.   Admit Weight 201 lb 15.1 oz (91.6 kg)   Expected Outcomes Short Term: Continue to assess and modify interventions until short term weight is achieved;Long Term: Adherence to nutrition and physical activity/exercise program aimed toward attainment of established weight goal;Weight Loss: Understanding of general recommendations for a balanced deficit meal plan, which promotes 1-2 lb weight loss per week and includes a negative energy balance of [id number] kcal/d   Increase Strength and Stamina Yes   Intervention Provide advice, education, support and counseling about physical activity/exercise needs.;Develop an individualized exercise prescription for aerobic and resistive training based on initial evaluation findings, risk stratification, comorbidities and  participant's personal goals.   Expected Outcomes Achievement of increased cardiorespiratory fitness and enhanced flexibility, muscular endurance and strength shown through measurements of functional capacity and personal statement of participant.   Lipids Yes   Intervention Provide education and support for participant on nutrition & aerobic/resistive exercise along with prescribed medications to achieve LDL <53m, HDL >457m   Expected Outcomes Short Term: Participant states understanding of desired cholesterol values and is compliant with medications prescribed. Participant is following exercise prescription and nutrition guidelines.;Long Term: Cholesterol controlled with medications as prescribed, with individualized exercise RX and with personalized nutrition plan. Value goals: LDL < 7016mHDL > 40 mg.      Core Components/Risk Factors/Patient Goals Review:      Goals and Risk Factor Review    Row Name 10/22/15  1716 11/19/15 1037 12/19/15 1226 01/25/16 0903       Core Components/Risk Factors/Patient Goals Review   Personal Goals Review Other Increase Strength and Stamina Other Weight Management/Obesity    Review Pt has started HEP and notice some weakness in legs. Will adjust program to cater to  strengthening the lower extremity  "strength is coming slowly" have noticed that leg muscles are getting stronger Strength and energy is better Pt wt is down 7.9 lb. Wt loss goal met.     Expected Outcomes Pt will have strength in legs and be able to continue with CR and HEP Pt legs are getting stronger will continue to progress in strength and cardiovascular endurance Pt will continue to feel better Continue to encourage wt loss of 1-2 lb/week.        Core Components/Risk Factors/Patient Goals at Discharge (Final Review):      Goals and Risk Factor Review - 01/25/16 0903      Core Components/Risk Factors/Patient Goals Review   Personal Goals Review Weight Management/Obesity   Review Pt wt is down 7.9 lb. Wt loss goal met.    Expected Outcomes Continue to encourage wt loss of 1-2 lb/week.       ITP Comments:     ITP Comments    Row Name 10/02/15 1602 11/16/15 0809         ITP Comments Dr. TraFransico Himedical Director  attended Hypertension education video/lecture;  meets goals/outcomes         Comments: Pt graduated from cardiac rehab program today with completion of 36 exercise sessions in Phase II. Pt maintained good attendance and progressed nicely during his participation in rehab as evidenced by increased MET level.   Medication list reconciled. Repeat  PHQ score- 0 .  Pt has made significant lifestyle changes and should be commended for his success. Pt feels he has achieved his goals during cardiac rehab, which include increased muscle strength, decreased muscular fatigue and increased overall stamina.  Pt plans to continue exercising on his own riding his bicycle, floor exercises and home  weights.

## 2016-01-07 ENCOUNTER — Encounter (HOSPITAL_COMMUNITY): Payer: Medicare HMO

## 2016-01-07 ENCOUNTER — Ambulatory Visit (HOSPITAL_COMMUNITY): Payer: Medicare HMO

## 2016-01-09 ENCOUNTER — Ambulatory Visit (HOSPITAL_COMMUNITY): Payer: Medicare HMO

## 2016-01-09 ENCOUNTER — Encounter (HOSPITAL_COMMUNITY): Payer: Medicare HMO

## 2016-01-11 ENCOUNTER — Ambulatory Visit (HOSPITAL_COMMUNITY): Payer: Medicare HMO

## 2016-01-11 ENCOUNTER — Encounter (HOSPITAL_COMMUNITY): Payer: Medicare HMO

## 2016-01-14 ENCOUNTER — Encounter (HOSPITAL_COMMUNITY): Payer: Medicare HMO

## 2016-01-14 ENCOUNTER — Ambulatory Visit (HOSPITAL_COMMUNITY): Payer: Medicare HMO

## 2016-01-16 ENCOUNTER — Encounter (HOSPITAL_COMMUNITY): Payer: Medicare HMO

## 2016-01-16 ENCOUNTER — Ambulatory Visit (HOSPITAL_COMMUNITY): Payer: Medicare HMO

## 2016-01-18 ENCOUNTER — Encounter (HOSPITAL_COMMUNITY): Payer: Medicare HMO

## 2016-01-18 ENCOUNTER — Ambulatory Visit (HOSPITAL_COMMUNITY): Payer: Medicare HMO

## 2016-01-21 ENCOUNTER — Ambulatory Visit (HOSPITAL_COMMUNITY): Payer: Medicare HMO

## 2016-01-21 ENCOUNTER — Encounter (HOSPITAL_COMMUNITY): Payer: Medicare HMO

## 2016-01-23 ENCOUNTER — Ambulatory Visit (HOSPITAL_COMMUNITY): Payer: Medicare HMO

## 2016-01-23 ENCOUNTER — Encounter (HOSPITAL_COMMUNITY): Payer: Medicare HMO

## 2016-01-23 NOTE — Addendum Note (Signed)
Encounter addended by: Valen Mascaro D Royalti Schauf on: 01/23/2016 11:22 AM<BR>    Actions taken: Flowsheet accepted, Flowsheet data copied forward, Visit Navigator Flowsheet section accepted

## 2016-01-24 ENCOUNTER — Telehealth: Payer: Self-pay | Admitting: Cardiovascular Disease

## 2016-01-24 NOTE — Telephone Encounter (Signed)
Pt sates he is having sinus drainage and stuff nose. Pt was made aware that an over the counter he can take would be like coricidin, or Claritin without  the decongestant. Pt verbalized understanding.

## 2016-01-24 NOTE — Telephone Encounter (Signed)
New message     Patient wants to know what type of over the counter medication he can take for sinus drainage & stuff nose.

## 2016-01-25 ENCOUNTER — Ambulatory Visit (HOSPITAL_COMMUNITY): Payer: Medicare HMO

## 2016-01-25 ENCOUNTER — Encounter (HOSPITAL_COMMUNITY): Payer: Medicare HMO

## 2016-01-25 NOTE — Addendum Note (Signed)
Encounter addended by: Jewel Baize, RD on: 01/25/2016  9:08 AM<BR>    Actions taken: Visit Navigator Flowsheet section accepted

## 2016-01-28 ENCOUNTER — Ambulatory Visit (HOSPITAL_COMMUNITY): Payer: Medicare HMO

## 2016-01-28 ENCOUNTER — Encounter (HOSPITAL_COMMUNITY): Payer: Medicare HMO

## 2016-01-30 ENCOUNTER — Encounter (HOSPITAL_COMMUNITY): Payer: Medicare HMO

## 2016-01-30 ENCOUNTER — Ambulatory Visit (HOSPITAL_COMMUNITY): Payer: Medicare HMO

## 2016-02-01 ENCOUNTER — Encounter (HOSPITAL_COMMUNITY): Payer: Medicare HMO

## 2016-02-01 ENCOUNTER — Ambulatory Visit (HOSPITAL_COMMUNITY): Payer: Medicare HMO

## 2016-02-04 ENCOUNTER — Encounter (HOSPITAL_COMMUNITY): Payer: Medicare HMO

## 2016-02-04 ENCOUNTER — Ambulatory Visit (HOSPITAL_COMMUNITY): Payer: Medicare HMO

## 2016-02-06 ENCOUNTER — Encounter (HOSPITAL_COMMUNITY): Payer: Medicare HMO

## 2016-02-06 ENCOUNTER — Ambulatory Visit (HOSPITAL_COMMUNITY): Payer: Medicare HMO

## 2016-02-08 ENCOUNTER — Ambulatory Visit (HOSPITAL_COMMUNITY): Payer: Medicare HMO

## 2016-04-30 DIAGNOSIS — Z79899 Other long term (current) drug therapy: Secondary | ICD-10-CM | POA: Diagnosis not present

## 2016-04-30 DIAGNOSIS — E782 Mixed hyperlipidemia: Secondary | ICD-10-CM | POA: Diagnosis not present

## 2016-04-30 DIAGNOSIS — R972 Elevated prostate specific antigen [PSA]: Secondary | ICD-10-CM | POA: Diagnosis not present

## 2016-04-30 DIAGNOSIS — I2510 Atherosclerotic heart disease of native coronary artery without angina pectoris: Secondary | ICD-10-CM | POA: Diagnosis not present

## 2016-04-30 DIAGNOSIS — R7301 Impaired fasting glucose: Secondary | ICD-10-CM | POA: Diagnosis not present

## 2016-05-02 DIAGNOSIS — Z79899 Other long term (current) drug therapy: Secondary | ICD-10-CM | POA: Diagnosis not present

## 2016-05-02 DIAGNOSIS — N529 Male erectile dysfunction, unspecified: Secondary | ICD-10-CM | POA: Diagnosis not present

## 2016-05-02 DIAGNOSIS — R7301 Impaired fasting glucose: Secondary | ICD-10-CM | POA: Diagnosis not present

## 2016-05-02 DIAGNOSIS — E782 Mixed hyperlipidemia: Secondary | ICD-10-CM | POA: Diagnosis not present

## 2016-05-02 DIAGNOSIS — I2510 Atherosclerotic heart disease of native coronary artery without angina pectoris: Secondary | ICD-10-CM | POA: Diagnosis not present

## 2016-05-02 DIAGNOSIS — R972 Elevated prostate specific antigen [PSA]: Secondary | ICD-10-CM | POA: Diagnosis not present

## 2016-05-08 ENCOUNTER — Encounter: Payer: Self-pay | Admitting: Cardiovascular Disease

## 2016-05-08 ENCOUNTER — Ambulatory Visit (INDEPENDENT_AMBULATORY_CARE_PROVIDER_SITE_OTHER): Payer: PPO | Admitting: Cardiovascular Disease

## 2016-05-08 VITALS — BP 140/78 | HR 56 | Ht 67.75 in | Wt 206.0 lb

## 2016-05-08 DIAGNOSIS — I2510 Atherosclerotic heart disease of native coronary artery without angina pectoris: Secondary | ICD-10-CM

## 2016-05-08 DIAGNOSIS — E785 Hyperlipidemia, unspecified: Secondary | ICD-10-CM

## 2016-05-08 NOTE — Patient Instructions (Signed)
Medication Instructions:  Your physician recommends that you continue on your current medications as directed. Please refer to the Current Medication list given to you today.  Labwork: No new orders.   Testing/Procedures: No new orders.   Follow-Up: Your physician recommends that you schedule a follow-up appointment in: MAY/JUNE with Richardson Dopp PA-C  Your physician wants you to follow-up in: 1 YEAR with Dr Burt Knack.  You will receive a reminder letter in the mail two months in advance. If you don't receive a letter, please call our office to schedule the follow-up appointment.   Any Other Special Instructions Will Be Listed Below (If Applicable).     If you need a refill on your cardiac medications before your next appointment, please call your pharmacy.

## 2016-05-08 NOTE — Progress Notes (Signed)
Cardiology Office Note Date:  05/08/2016   ID:  Lance Hancock, DOB 1940/10/14, MRN [id number]  PCP:  Lance Connors, PA-C  Cardiologist:  Lance Mocha, MD    Chief Complaint  Patient presents with  . 6 month office visit     History of Present Illness: Lance Hancock is a 76 y.o. male who presents for follow-up of coronary artery disease. The patient presented 08/15/2015 with an inferior STEMI. He was treated with primary PCI using overlapping drug-eluting stents in the right coronary artery. He was noted to have mild nonobstructive CAD elsewhere. Follow-up echocardiogram demonstrated preserved LV systolic function.  The patient is doing well. He has not been as consistent with his exercise routine. Says that he's taken a few vacations and hasn't been following a good diet. He feels well and doesn't have any cardiac symptoms. He specifically denies chest pain, chest pressure, palpitations, or shortness of breath. He is compliant with his medications.  Past Medical History:  Diagnosis Date  . CAD (coronary artery disease)    a. STEMI 08/2015 3.5x64m Promus DES to prox RCA, 3.0x29mPromus DES to prox RCA.   . Lance Kitchenistory of echocardiogram    a. Echo 5/17: EF 55-60%, normal wall motion, normal diastolic function, MAC, moderate LAE  . ST elevation (STEMI) [place]    Past Surgical History:  Procedure Laterality Date  . BREAST SURGERY     age 76 . Lance KitchenARDIAC CATHETERIZATION N/A 08/15/2015   Procedure: Left Heart Cath and Coronary Angiography;  Surgeon: MiSherren MochaMD;  [facility];  Service: Cardiovascular;  Laterality: N/A;  . CARDIAC CATHETERIZATION N/A 08/15/2015   Procedure: Coronary Stent Intervention;  Surgeon: MiSherren MochaMD;  [facility];  Service: Cardiovascular;  Laterality: N/A;  Proximal RCA  (3.5/16 and 3.0/20 Promus)  . EYE SURGERY      Current Outpatient Prescriptions    Medication Sig Dispense Refill  . aspirin 81 MG tablet Take 81 mg by mouth daily.    . Lance Kitchentorvastatin (LIPITOR) 80 MG tablet Take 1 tablet (80 mg total) by mouth daily at 6 PM. 30 tablet 12  . co-enzyme Q-10 50 MG capsule Take 100 mg by mouth 2 (two) times daily.     . Lance KitchenLUCOSAMINE-CHONDROITIN PO Take 1,200 mg by mouth daily.    . metoprolol tartrate (LOPRESSOR) 25 MG tablet Take 0.5 tablets (12.5 mg total) by mouth 2 (two) times daily. 15 tablet 12  . Multiple Vitamin (MULTIVITAMIN) tablet Take 1 tablet by mouth daily. Takes Reliv vitamin And herbs    . nitroGLYCERIN (NITROSTAT) 0.4 MG SL tablet Place 1 tablet (0.4 mg total) under the tongue every 5 (five) minutes x 3 doses as needed for chest pain. 25 tablet 1  . Omega-3 Fatty Acids (FISH OIL ULTRA) 1400 MG CAPS Take 1,400 mg by mouth 2 (two) times daily.    . ticagrelor (BRILINTA) 90 MG TABS tablet Take 1 tablet (90 mg total) by mouth 2 (two) times daily. 60 tablet 12   No current facility-administered medications for this visit.     Allergies:   Patient has no known allergies.   Social History:  The patient  reports that he quit smoking about 40 years ago. His smoking use included Cigarettes. He has a 36.00 pack-year smoking history. He has quit using smokeless tobacco. He reports that he drinks alcohol. He reports that he does not use drugs.   Family History:  The  patient's  family history includes Heart disease in his sister; Hyperlipidemia in his mother; Hypertension in his mother.    ROS:  Please see the history of present illness.  Otherwise, review of systems is positive for easy bruising.  All other systems are reviewed and negative.   PHYSICAL EXAM: VS:  BP 140/78   Pulse (!) 56   Ht 5' 7.75" (1.721 m)   Wt 206 lb (93.4 kg)   BMI 31.55 kg/m  , BMI Body mass index is 31.55 kg/m. GEN: Well nourished, well developed, in no acute distress  HEENT: normal  Neck: no JVD, no masses. No carotid bruits Cardiac: RRR without murmur  or gallop                Respiratory:  clear to auscultation bilaterally, normal work of breathing GI: soft, nontender, nondistended, + BS MS: no deformity or atrophy  Ext: no pretibial edema, pedal pulses 2+= bilaterally Skin: warm and dry, no rash Neuro:  Strength and sensation are intact Psych: euthymic mood, full affect  EKG:  EKG is ordered today. The ekg ordered today shows sinus brady, LAD, voltage criteria for LVH, nonspecific T wave abnormality  Recent Labs: 08/15/2015: TSH 1.889 08/16/2015: BUN 12; Creatinine, Ser 1.00; Hemoglobin 14.3; Platelets 161; Potassium 4.1; Sodium 139   Lipid Panel     Component Value Date/Time   CHOL 138 08/15/2015 0351   TRIG 26 08/15/2015 0351   HDL 43 08/15/2015 0351   CHOLHDL 3.2 08/15/2015 0351   VLDL 5 08/15/2015 0351   LDLCALC 90 08/15/2015 0351      Wt Readings from Last 3 Encounters:  05/08/16 206 lb (93.4 kg)  01/23/16 194 lb 14.2 oz (88.4 kg)  10/15/15 200 lb 12.8 oz (91.1 kg)     Cardiac Studies Reviewed: 2-D echocardiogram 09/11/2015: Study Conclusions  - Left ventricle: The cavity size was normal. Systolic function was  normal. The estimated ejection fraction was in the range of 55%  to 60%. Wall motion was normal; there were no regional wall  motion abnormalities. Left ventricular diastolic function  parameters were normal. - Mitral valve: Calcified annulus. Mildly thickened leaflets . - Left atrium: The atrium was moderately dilated. - Atrial septum: No defect or patent foramen ovale was identified.  Cardiac catheterization 08/15/2015:    Conclusion     Prox RCA lesion, 99% stenosed. Post intervention, there is a 0% residual stenosis.  Ost LM lesion, 30% stenosed.  There is mild left ventricular systolic dysfunction.  Mid LAD to Dist LAD lesion, 30% stenosed.  Prox Cx to Mid Cx lesion, 25% stenosed.  1. Subtotal occlusion of the RCA with TIMI 2 flow, treated successfully with overlapping drug-eluting  stents in the proximal vessel 2. Mild nonobstructive plaque in the left main and LAD 3. Mild segmental LV contraction abnormality with preserved overall LV function     ASSESSMENT AND PLAN: 1.  Coronary artery disease, native vessel, with old MI: The patient is stable without symptoms of angina. We discussed lifestyle modification with reinitiation of his exercise program and dietary changes focusing on a lower intake of carbohydrates and simple sugars. He will continue on dual antiplatelet therapy through May until he is one year out from his MI. At that time I think we could consider stopping Brilinta and having him continue on aspirin 81 mg daily. I would like him to return for follow-up and see one of our AP APPs when he is one year out from his MI. He  will continue on a low-dose of metoprolol.  2. Hyperlipidemia: Lipids are reviewed through care everywhere. His cholesterol is 143, LDL 77, HDL 46. He will continue on atorvastatin 80 mg.  Current medicines are reviewed with the patient today.  The patient does not have concerns regarding medicines.  Labs/ tests ordered today include:   Orders Placed This Encounter  Procedures  . EKG 12-Lead    Disposition:   FU 4 months with APP, then one year with me.  Deatra James, MD  05/08/2016 10:18 AM    [facility] [address], [place], [place]  [address] Phone: [phone]; Fax: [phone]

## 2016-07-10 DIAGNOSIS — L821 Other seborrheic keratosis: Secondary | ICD-10-CM | POA: Diagnosis not present

## 2016-07-10 DIAGNOSIS — L570 Actinic keratosis: Secondary | ICD-10-CM | POA: Diagnosis not present

## 2016-07-10 DIAGNOSIS — L814 Other melanin hyperpigmentation: Secondary | ICD-10-CM | POA: Diagnosis not present

## 2016-07-10 DIAGNOSIS — D229 Melanocytic nevi, unspecified: Secondary | ICD-10-CM | POA: Diagnosis not present

## 2016-08-07 ENCOUNTER — Other Ambulatory Visit: Payer: Self-pay | Admitting: Cardiovascular Disease

## 2016-08-07 ENCOUNTER — Telehealth: Payer: Self-pay | Admitting: Cardiovascular Disease

## 2016-08-07 MED ORDER — ATORVASTATIN CALCIUM 80 MG PO TABS: 80.0000 mg | ORAL_TABLET | Freq: Every day | ORAL | 2 refills | Status: DC

## 2016-08-07 NOTE — Telephone Encounter (Signed)
Pt's medication was sent to pt's pharmacy as requested. Confirmation received.  °

## 2016-08-07 NOTE — Telephone Encounter (Signed)
New message    *STAT* If patient is at the pharmacy, call can be transferred to refill team.   1. Which medications need to be refilled? (please list name of each medication and dose if known) atorvastatin 80 mg   2. Which pharmacy/location (including street and city if local pharmacy) is medication to be sent to? [facility] at [address]  3. Do they need a 30 day or 90 day supply? 90 day

## 2016-08-07 NOTE — Telephone Encounter (Signed)
Pt's medication sent to pt's pharmacy as requested. Confirmation received.  °

## 2016-08-14 ENCOUNTER — Encounter: Payer: Self-pay | Admitting: *Deleted

## 2016-08-29 DIAGNOSIS — H52203 Unspecified astigmatism, bilateral: Secondary | ICD-10-CM | POA: Diagnosis not present

## 2016-08-29 DIAGNOSIS — H2513 Age-related nuclear cataract, bilateral: Secondary | ICD-10-CM | POA: Diagnosis not present

## 2016-08-29 DIAGNOSIS — H524 Presbyopia: Secondary | ICD-10-CM | POA: Diagnosis not present

## 2016-09-01 ENCOUNTER — Other Ambulatory Visit: Payer: Self-pay | Admitting: Physician Assistant

## 2016-09-01 ENCOUNTER — Encounter: Payer: Self-pay | Admitting: Physician Assistant

## 2016-09-01 ENCOUNTER — Ambulatory Visit (INDEPENDENT_AMBULATORY_CARE_PROVIDER_SITE_OTHER): Payer: PPO | Admitting: Physician Assistant

## 2016-09-01 VITALS — BP 120/62 | HR 54 | Ht 67.5 in | Wt 209.0 lb

## 2016-09-01 DIAGNOSIS — I447 Left bundle-branch block, unspecified: Secondary | ICD-10-CM | POA: Diagnosis not present

## 2016-09-01 DIAGNOSIS — E785 Hyperlipidemia, unspecified: Secondary | ICD-10-CM

## 2016-09-01 DIAGNOSIS — I25119 Atherosclerotic heart disease of native coronary artery with unspecified angina pectoris: Secondary | ICD-10-CM | POA: Diagnosis not present

## 2016-09-01 DIAGNOSIS — I2510 Atherosclerotic heart disease of native coronary artery without angina pectoris: Secondary | ICD-10-CM | POA: Insufficient documentation

## 2016-09-01 MED ORDER — METOPROLOL TARTRATE 25 MG PO TABS: 12.5000 mg | ORAL_TABLET | Freq: Two times a day (BID) | ORAL | 3 refills | Status: DC

## 2016-09-01 MED ORDER — ATORVASTATIN CALCIUM 80 MG PO TABS: 80.0000 mg | ORAL_TABLET | Freq: Every day | ORAL | 3 refills | Status: DC

## 2016-09-01 MED ORDER — TICAGRELOR 90 MG PO TABS: 90.0000 mg | ORAL_TABLET | Freq: Two times a day (BID) | ORAL | 3 refills | Status: DC

## 2016-09-01 MED ORDER — NITROGLYCERIN 0.4 MG SL SUBL: 0.4000 mg | SUBLINGUAL_TABLET | SUBLINGUAL | 3 refills | Status: DC | PRN

## 2016-09-01 NOTE — Patient Instructions (Signed)
Medication Instructions:  REFILLS HAVE BEEN SENT IN FOR BRILINTA, LIPITOR, METOPROLOL, NTG  Labwork: NONE ORDERED  Testing/Procedures: Your physician has requested that you have a lexiscan myoview. For further information please visit [email or web address]. Please follow instruction sheet, as given.    Follow-Up: Your physician wants you to follow-up in: 6 MONTHS WITH DR. Emelda Fear will receive a reminder letter in the mail two months in advance. If you don't receive a letter, please call our office to schedule the follow-up appointment.   Any Other Special Instructions Will Be Listed Below (If Applicable).     If you need a refill on your cardiac medications before your next appointment, please call your pharmacy.

## 2016-09-01 NOTE — Progress Notes (Signed)
Cardiology Office Note:    Date:  09/01/2016   ID:  Lance Hancock, DOB Mar 06, 1941, MRN [id number]  PCP:  Lance Connors, PA-C  Cardiologist:  Lance Hancock    Referring MD: Lance Connors, PA-C   Chief Complaint  Patient presents with  . Coronary Artery Disease    follow up    History of Present Illness:    Lance Hancock is a 76 y.o. male with a hx of CAD s/p inferior STEMI in 5/17 treated with a DES to the RCA, HL.  Last seen by Dr. Burt Hancock 1/18. He returns for routine follow-up.   [name] is here alone.  He does note occasional throat tightness at the beginning of exercise on the treadmill.  He denies any symptoms like his myocardial infarction.  He denies shortness of breath, syncope, edema.  He denies any bleeding issues.    Prior CV studies:   The following studies were reviewed today:  Echo 09/11/15 EF 55-60, normal wall motion, normal diastolic function, MAC, moderate LAE  LHC 08/15/15 LM ostial 30% LAD mid 30% LCx proximal 25% RCA proximal 99% EF 55-60%, mild inferior wall hypokinesis PCI: 3 x 20 mm Promus premier DES, 3.5 x 16 mm Promus premier DES overlapping to the proximal RCA   Past Medical History:  Diagnosis Date  . CAD (coronary artery disease)    a. STEMI 08/2015 >> LHC:  oLM 30, mLAD 30, pLCx 25, pRCA 99, EF 55-60 inf HK >> PCI:  3.5x12m Promus DES to prox RCA, 3.0x246mPromus DES to prox RCA.   . Marland Kitchenistory of acute inferior wall MI 08/15/2015  . History of echocardiogram    a. Echo 5/17: EF 55-60%, normal wall motion, normal diastolic function, MAC, moderate LAE  . HLD (hyperlipidemia)     Past Surgical History:  Procedure Laterality Date  . BREAST SURGERY     age 76 . [place] KitchenARDIAC CATHETERIZATION N/A 08/15/2015   Procedure: Left Heart Cath and Coronary Angiography;  Surgeon: Lance Hancock;  [facility];  Service: Cardiovascular;  Laterality: N/A;  . CARDIAC CATHETERIZATION N/A 08/15/2015   Procedure: Coronary Stent  Intervention;  Surgeon: Lance Hancock;  [facility];  Service: Cardiovascular;  Laterality: N/A;  Proximal RCA  (3.5/16 and 3.0/20 Promus)  . EYE SURGERY      Current Medications: Current Meds  Medication Sig  . aspirin 81 MG tablet Take 81 mg by mouth daily.  . [place] Kitchentorvastatin (LIPITOR) 80 MG tablet Take 1 tablet (80 mg total) by mouth daily at 6 PM.  . Coenzyme Q10 100 MG capsule Take 100 mg by mouth 2 (two) times daily.  . Marland KitchenLUCOSAMINE-CHONDROITIN PO Take 1,200 mg by mouth daily.  . metoprolol tartrate (LOPRESSOR) 25 MG tablet Take 0.5 tablets (12.5 mg total) by mouth 2 (two) times daily.  . Multiple Vitamin (MULTIVITAMIN) tablet Take 1 tablet by mouth daily. Takes Reliv vitamin And herbs  . nitroGLYCERIN (NITROSTAT) 0.4 MG SL tablet Place 1 tablet (0.4 mg total) under the tongue every 5 (five) minutes x 3 doses as needed for chest pain.  . Omega-3 Fatty Acids (FISH OIL ULTRA) 1400 MG CAPS Take 1,400 mg by mouth 2 (two) times daily.  . ticagrelor (BRILINTA) 90 MG TABS tablet Take 1 tablet (90 mg total) by mouth 2 (two) times daily.  . [DISCONTINUED] atorvastatin (LIPITOR) 80 MG tablet Take 1 tablet (80 mg total) by mouth daily at 6 PM.  . [DISCONTINUED] metoprolol  tartrate (LOPRESSOR) 25 MG tablet Take 0.5 tablets (12.5 mg total) by mouth 2 (two) times daily.  . [DISCONTINUED] nitroGLYCERIN (NITROSTAT) 0.4 MG SL tablet Place 1 tablet (0.4 mg total) under the tongue every 5 (five) minutes x 3 doses as needed for chest pain.  . [DISCONTINUED] ticagrelor (BRILINTA) 90 MG TABS tablet Take 1 tablet (90 mg total) by mouth 2 (two) times daily.     Allergies:   Patient has no known allergies.   Social History   Social History  . Marital status: Married    Spouse name: N/A  . Number of children: N/A  . Years of education: N/A   Social History Main Topics  . Smoking status: Former Smoker    Packs/day: 2.00    Years: 18.00    Types: Cigarettes    Quit date: 01/19/1976    . Smokeless tobacco: Former Systems developer  . Alcohol use Yes     Comment: occasional  . Drug use: No  . Sexual activity: Yes    Partners: Female   Other Topics Concern  . None   Social History Narrative   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     Family Hx: The patient's family history includes Heart disease in his sister; Hyperlipidemia in his mother; Hypertension in his mother.  ROS:   Please see the history of present illness.    ROS All other systems reviewed and are negative.   EKGs/Labs/Other Test Reviewed:    EKG:  EKG is  ordered today.  The ekg ordered today demonstrates sinus bradycardia, HR 54, LBBB (new)  Recent Labs: No results found for requested labs within last 8760 hours.   Recent Lipid Panel    Component Value Date/Time   CHOL 138 08/15/2015 0351   TRIG 26 08/15/2015 0351   HDL 43 08/15/2015 0351   CHOLHDL 3.2 08/15/2015 0351   VLDL 5 08/15/2015 0351   LDLCALC 90 08/15/2015 0351     Physical Exam:    VS:  BP 120/62   Pulse (!) 54   Ht 5' 7.5" (1.715 m)   Wt 209 lb (94.8 kg)   BMI 32.25 kg/m     Wt Readings from Last 3 Encounters:  09/01/16 209 lb (94.8 kg)  05/08/16 206 lb (93.4 kg)  01/23/16 194 lb 14.2 oz (88.4 kg)     Physical Exam  Constitutional: He is oriented to person, place, and time. He appears well-developed and well-nourished. No distress.  HENT:  Head: Normocephalic and atraumatic.  Eyes: No scleral icterus.  Neck: Normal range of motion. No JVD present.  Cardiovascular: Normal rate, regular rhythm, S1 normal and S2 normal.   No murmur heard. Pulmonary/Chest: Effort normal and breath sounds normal. He has no wheezes. He has no rhonchi. He has no rales.  Abdominal: Soft. There is no tenderness.  Musculoskeletal: He exhibits no edema.  Neurological: He is alert and oriented to person, place, and time.  Skin: Skin is warm and dry.  Psychiatric: He has a normal mood and  affect.    ASSESSMENT:    1. Coronary artery disease involving native coronary artery of native heart with angina pectoris ([place])   2. Hyperlipidemia, unspecified hyperlipidemia type   3. LBBB (left bundle branch block)    PLAN:    In order of problems listed above:  1. Coronary artery disease involving native coronary artery of native heart with angina pectoris [facility]) - He is  s/p inferior STEMI in 5/17 tx with DES.  He returns today for follow up and has a new LBBB on ECG.  He has had some mild throat tightness at the beginning of exercise that resolves without stopping.  He is able to do some activities without symptoms.  He denies any symptoms reminiscent of his prior angina.  I d/w Lance Hancock today.  His QRS was previously s/w widened.  We suspect the LBBB is related to underling conduction system disease. Since is he is having +/- CCS Class II angina, we will proceed with Lexiscan Myoview to rule out significant ischemia.    -  Continue ASA, Ticagrelor, beta-blocker, Atorvastatin  -  BP too low to add or increase anti-anginals  -  Proceed with Lexiscan Myoview  -  If high risk or symptoms progress, will need to pursue cardiac cath.   2. Hyperlipidemia, unspecified hyperlipidemia type - Continue statin.   3. LBBB (left bundle branch block) - Plan: Myocardial Perfusion Imaging -  New LBBB.  Will proceed with Myoview as noted.    Dispo:  Return in about 6 months (around 03/04/2017) for Routine Follow Up, w/ Dr. Burt Hancock.   Medication Adjustments/Labs and Tests Ordered: Current medicines are reviewed at length with the patient today.  Concerns regarding medicines are outlined above.  Orders/Tests:  Orders Placed This Encounter  Procedures  . Myocardial Perfusion Imaging  . EKG 12-Lead   Medication changes: Meds ordered this encounter  Medications  . atorvastatin (LIPITOR) 80 MG tablet    Sig: Take 1 tablet (80 mg total) by mouth daily at 6 PM.    Dispense:  90 tablet     Refill:  3  . metoprolol tartrate (LOPRESSOR) 25 MG tablet    Sig: Take 0.5 tablets (12.5 mg total) by mouth 2 (two) times daily.    Dispense:  90 tablet    Refill:  3  . nitroGLYCERIN (NITROSTAT) 0.4 MG SL tablet    Sig: Place 1 tablet (0.4 mg total) under the tongue every 5 (five) minutes x 3 doses as needed for chest pain.    Dispense:  25 tablet    Refill:  3  . ticagrelor (BRILINTA) 90 MG TABS tablet    Sig: Take 1 tablet (90 mg total) by mouth 2 (two) times daily.    Dispense:  180 tablet    Refill:  3   Signed, Richardson Dopp, PA-C  09/01/2016 11:03 AM    [facility] [address], [place], [place]  [address] Phone: [phone]; Fax: [phone]

## 2016-09-04 ENCOUNTER — Telehealth (HOSPITAL_COMMUNITY): Payer: Self-pay | Admitting: *Deleted

## 2016-09-04 NOTE — Telephone Encounter (Signed)
Patient given detailed instructions per Myocardial Perfusion Study Information Sheet for the test on 09/09/16 Patient notified to arrive 15 minutes early and that it is imperative to arrive on time for appointment to keep from having the test rescheduled.  If you need to cancel or reschedule your appointment, please call the office within 24 hours of your appointment. . Patient verbalized understanding. Ruven Corradi Jacqueline    

## 2016-09-09 ENCOUNTER — Telehealth: Payer: Self-pay | Admitting: *Deleted

## 2016-09-09 ENCOUNTER — Encounter: Payer: Self-pay | Admitting: Physician Assistant

## 2016-09-09 ENCOUNTER — Ambulatory Visit (HOSPITAL_COMMUNITY): Payer: PPO | Attending: Cardiovascular Disease

## 2016-09-09 DIAGNOSIS — I447 Left bundle-branch block, unspecified: Secondary | ICD-10-CM | POA: Diagnosis not present

## 2016-09-09 DIAGNOSIS — I25119 Atherosclerotic heart disease of native coronary artery with unspecified angina pectoris: Secondary | ICD-10-CM | POA: Diagnosis not present

## 2016-09-09 LAB — MYOCARDIAL PERFUSION IMAGING
CHL CUP NUCLEAR SSS: 9
CSEPPHR: 70 {beats}/min
LVDIAVOL: 126 mL (ref 62–150)
LVSYSVOL: 59 mL
RATE: 0.28
Rest HR: 47 {beats}/min
SDS: 0
SRS: 9
TID: 1.11

## 2016-09-09 MED ORDER — CLOPIDOGREL BISULFATE 75 MG PO TABS: 75.0000 mg | ORAL_TABLET | Freq: Every day | ORAL | 3 refills | Status: DC

## 2016-09-09 MED ORDER — TECHNETIUM TC 99M TETROFOSMIN IV KIT
32.8000 | PACK | Freq: Once | INTRAVENOUS | Status: AC | PRN
  Administered 2016-09-09: 32.8 via INTRAVENOUS
  Filled 2016-09-09: qty 33

## 2016-09-09 MED ORDER — REGADENOSON 0.4 MG/5ML IV SOLN
0.4000 mg | Freq: Once | INTRAVENOUS | Status: AC
  Administered 2016-09-09: 0.4 mg via INTRAVENOUS

## 2016-09-09 MED ORDER — TECHNETIUM TC 99M TETROFOSMIN IV KIT
10.7000 | PACK | Freq: Once | INTRAVENOUS | Status: AC | PRN
  Administered 2016-09-09: 10.7 via INTRAVENOUS
  Filled 2016-09-09: qty 11

## 2016-09-09 NOTE — Telephone Encounter (Signed)
This encounter was created in error - please disregard.

## 2016-09-09 NOTE — Telephone Encounter (Signed)
-----   Message from Liliane Shi, [place] sent at 09/09/2016  4:14 PM EDT ----- Please call the patient. The stress test is ok.  It does not show any ischemia (loss of blood flow from a blockage).   No further testing needed.  Continue current medications.  Please fax a copy of this study result to his PCP:  Fransisca Connors, PA-C  Thanks! Richardson Dopp, PA-C    09/09/2016 4:11 PM

## 2016-09-09 NOTE — Telephone Encounter (Signed)
Pt has been notified of Myoview results and medications recommendations. Pt will finish his current Rx of he next Brilinta. Beginning the next day after stopping Brilinta pt aware he will start Palvix 75 mg 1 tablet daily. Pt asked since he just got the Brilinta and it is a 3 month supply, can he finish this first before starting the Plavix. I d/w PA Richardson Dopp who said that is fine. Pt aware to continue on ASA 81 mg daily. Pt aware to call the office when he is down to his last week of Brilinta for the Rx for Plavix to be sent to the pharmacy. Pt verbalized understanding to plan of care and to all instructions. I will enter Plavix into pt's chart so it will be ready in 3 months to refill. Pt thanked me for my time and taking the time to explain things to him about his stress test.

## 2016-10-28 DIAGNOSIS — Z79899 Other long term (current) drug therapy: Secondary | ICD-10-CM | POA: Diagnosis not present

## 2016-10-28 DIAGNOSIS — R7301 Impaired fasting glucose: Secondary | ICD-10-CM | POA: Diagnosis not present

## 2016-10-28 DIAGNOSIS — E782 Mixed hyperlipidemia: Secondary | ICD-10-CM | POA: Diagnosis not present

## 2016-10-28 DIAGNOSIS — R972 Elevated prostate specific antigen [PSA]: Secondary | ICD-10-CM | POA: Diagnosis not present

## 2016-10-30 DIAGNOSIS — E782 Mixed hyperlipidemia: Secondary | ICD-10-CM | POA: Diagnosis not present

## 2016-10-30 DIAGNOSIS — R972 Elevated prostate specific antigen [PSA]: Secondary | ICD-10-CM | POA: Diagnosis not present

## 2016-10-30 DIAGNOSIS — E119 Type 2 diabetes mellitus without complications: Secondary | ICD-10-CM | POA: Diagnosis not present

## 2016-10-30 DIAGNOSIS — I2510 Atherosclerotic heart disease of native coronary artery without angina pectoris: Secondary | ICD-10-CM | POA: Diagnosis not present

## 2017-01-28 DIAGNOSIS — E119 Type 2 diabetes mellitus without complications: Secondary | ICD-10-CM | POA: Diagnosis not present

## 2017-02-02 DIAGNOSIS — R030 Elevated blood-pressure reading, without diagnosis of hypertension: Secondary | ICD-10-CM | POA: Diagnosis not present

## 2017-02-02 DIAGNOSIS — E782 Mixed hyperlipidemia: Secondary | ICD-10-CM | POA: Diagnosis not present

## 2017-02-02 DIAGNOSIS — I2510 Atherosclerotic heart disease of native coronary artery without angina pectoris: Secondary | ICD-10-CM | POA: Diagnosis not present

## 2017-02-02 DIAGNOSIS — E119 Type 2 diabetes mellitus without complications: Secondary | ICD-10-CM | POA: Diagnosis not present

## 2017-03-10 DIAGNOSIS — H52203 Unspecified astigmatism, bilateral: Secondary | ICD-10-CM | POA: Diagnosis not present

## 2017-03-10 DIAGNOSIS — H43393 Other vitreous opacities, bilateral: Secondary | ICD-10-CM | POA: Diagnosis not present

## 2017-03-10 DIAGNOSIS — H2513 Age-related nuclear cataract, bilateral: Secondary | ICD-10-CM | POA: Diagnosis not present

## 2017-03-10 DIAGNOSIS — H5213 Myopia, bilateral: Secondary | ICD-10-CM | POA: Diagnosis not present

## 2017-04-09 ENCOUNTER — Emergency Department (HOSPITAL_BASED_OUTPATIENT_CLINIC_OR_DEPARTMENT_OTHER)
Admission: EM | Admit: 2017-04-09 | Discharge: 2017-04-09 | Disposition: A | Discharge status: Home / Self Care | Payer: PPO | Attending: Emergency Medicine | Admitting: Emergency Medicine

## 2017-04-09 ENCOUNTER — Encounter (HOSPITAL_BASED_OUTPATIENT_CLINIC_OR_DEPARTMENT_OTHER): Payer: Self-pay

## 2017-04-09 ENCOUNTER — Emergency Department (HOSPITAL_BASED_OUTPATIENT_CLINIC_OR_DEPARTMENT_OTHER): Payer: PPO

## 2017-04-09 ENCOUNTER — Other Ambulatory Visit: Payer: Self-pay

## 2017-04-09 DIAGNOSIS — M7989 Other specified soft tissue disorders: Secondary | ICD-10-CM | POA: Diagnosis not present

## 2017-04-09 DIAGNOSIS — S60512A Abrasion of left hand, initial encounter: Secondary | ICD-10-CM

## 2017-04-09 DIAGNOSIS — Y92009 Unspecified place in unspecified non-institutional (private) residence as the place of occurrence of the external cause: Secondary | ICD-10-CM | POA: Diagnosis not present

## 2017-04-09 DIAGNOSIS — Y9389 Activity, other specified: Secondary | ICD-10-CM | POA: Insufficient documentation

## 2017-04-09 DIAGNOSIS — Y999 Unspecified external cause status: Secondary | ICD-10-CM | POA: Insufficient documentation

## 2017-04-09 DIAGNOSIS — Z87891 Personal history of nicotine dependence: Secondary | ICD-10-CM | POA: Diagnosis not present

## 2017-04-09 DIAGNOSIS — S60222A Contusion of left hand, initial encounter: Secondary | ICD-10-CM | POA: Diagnosis not present

## 2017-04-09 DIAGNOSIS — I2510 Atherosclerotic heart disease of native coronary artery without angina pectoris: Secondary | ICD-10-CM | POA: Diagnosis not present

## 2017-04-09 DIAGNOSIS — Z7902 Long term (current) use of antithrombotics/antiplatelets: Secondary | ICD-10-CM | POA: Diagnosis not present

## 2017-04-09 DIAGNOSIS — W01198A Fall on same level from slipping, tripping and stumbling with subsequent striking against other object, initial encounter: Secondary | ICD-10-CM | POA: Diagnosis not present

## 2017-04-09 DIAGNOSIS — W108XXA Fall (on) (from) other stairs and steps, initial encounter: Secondary | ICD-10-CM

## 2017-04-09 DIAGNOSIS — S6992XA Unspecified injury of left wrist, hand and finger(s), initial encounter: Secondary | ICD-10-CM | POA: Diagnosis present

## 2017-04-09 DIAGNOSIS — Z23 Encounter for immunization: Secondary | ICD-10-CM | POA: Insufficient documentation

## 2017-04-09 DIAGNOSIS — Z7982 Long term (current) use of aspirin: Secondary | ICD-10-CM | POA: Diagnosis not present

## 2017-04-09 MED ORDER — TETANUS-DIPHTH-ACELL PERTUSSIS 5-2.5-18.5 LF-MCG/0.5 IM SUSP
0.5000 mL | Freq: Once | INTRAMUSCULAR | Status: AC
  Administered 2017-04-09: 0.5 mL via INTRAMUSCULAR
  Filled 2017-04-09: qty 0.5

## 2017-04-09 NOTE — ED Triage Notes (Addendum)
Injured left hand against brick wall catching self before fell approx 45 min PTA-large amount of bruising/swelling and abrasion noted-pt does take blood thinner-NAD-steady gait

## 2017-04-09 NOTE — ED Notes (Signed)
ED Provider at bedside. 

## 2017-04-09 NOTE — ED Provider Notes (Signed)
[facility] Provider Note: Georgena Spurling, MD, FACEP  CSN: [id number] MRN: [id number] ARRIVAL: 04/09/17 at [place]: [place] [facility]   HISTORY OF PRESENT ILLNESS  04/09/17 11:00 PM Lance Hancock is a 76 y.o. male on 29.  He was carrying a vacuum cleaner down the steps about 40 minutes prior to arrival.  He lost his footing and fell striking his left hand against a brick wall.  He has a large hematoma to his dorsal left hand with an overlying abrasion.  He rates his pain as a 3 out of 10 and describes it as a stretching sensation in the skin.  Range of motion of the left hand is limited due to the hematoma.  He denies loss of sensation or motor function in the left hand.  He denies other injury.  He was given an ice pack on arrival.   Past Medical History:  Diagnosis Date  . CAD (coronary artery disease)    a. STEMI 08/2015 >> LHC:  oLM 30, mLAD 30, pLCx 25, pRCA 99, EF 55-60 inf HK >> PCI:  3.5x57m Promus DES to prox RCA, 3.0x244mPromus DES to prox RCA.   . Marland Kitchenistory of acute inferior wall MI 08/15/2015  . History of echocardiogram    a. Echo 5/17: EF 55-60%, normal wall motion, normal diastolic function, MAC, moderate LAE  . History of nuclear stress test    Myoview 5/18: Medium size, moderate intensity fixed septal defect - likely LBBB-related. No reversible ischemia. LVEF 53% with normal wall motion. This is a low risk study.  . Marland KitchenLD (hyperlipidemia)     Past Surgical History:  Procedure Laterality Date  . BREAST SURGERY     age 76 . Marland KitchenARDIAC CATHETERIZATION N/A 08/15/2015   Procedure: Left Heart Cath and Coronary Angiography;  Surgeon: MiSherren MochaMD;  [facility];  Service: Cardiovascular;  Laterality: N/A;  . CARDIAC CATHETERIZATION N/A 08/15/2015   Procedure: Coronary Stent Intervention;  Surgeon: MiSherren MochaMD;  [facility];  Service: Cardiovascular;  Laterality: N/A;  Proximal RCA  (3.5/16  and 3.0/20 Promus)  . EYE SURGERY      Family History  Problem Relation Age of Onset  . Hypertension Mother   . Hyperlipidemia Mother   . Heart disease Sister     Social History   Tobacco Use  . Smoking status: Former Smoker    Packs/day: 2.00    Years: 18.00    Pack years: 36.00    Types: Cigarettes    Last attempt to quit: 01/19/1976    Years since quitting: 41.2  . Smokeless tobacco: Former [employer] Topics  . Alcohol use: Yes    Comment: occasional  . Drug use: No    Prior to Admission medications   Medication Sig Start Date End Date Taking? Authorizing Provider  aspirin 81 MG tablet Take 81 mg by mouth daily.    [provider]  atorvastatin (LIPITOR) 80 MG tablet Take 1 tablet (80 mg total) by mouth daily at 6 PM. 09/01/16   WeKathlen ModyScNicki Reaper, PA-C  Coenzyme Q10 100 MG capsule Take 100 mg by mouth 2 (two) times daily.    [provider]  GLUCOSAMINE-CHONDROITIN PO Take 1,200 mg by mouth daily.    [provider]  metoprolol tartrate (LOPRESSOR) 25 MG tablet Take 0.5 tablets (12.5 mg total) by mouth 2 (two) times daily. 09/01/16   WeLiliane Shi  PA-C  Multiple Vitamin (MULTIVITAMIN) tablet Take 1 tablet by mouth daily. Takes Reliv vitamin And herbs    [provider]  nitroGLYCERIN (NITROSTAT) 0.4 MG SL tablet Place 1 tablet (0.4 mg total) under the tongue every 5 (five) minutes x 3 doses as needed for chest pain. 09/01/16   Richardson Dopp T, PA-C  Omega-3 Fatty Acids (FISH OIL ULTRA) 1400 MG CAPS Take 1,400 mg by mouth 2 (two) times daily.    [provider]  ticagrelor (BRILINTA) 90 MG TABS tablet Take 1 tablet (90 mg total) by mouth 2 (two) times daily. 09/01/16   Richardson Dopp T, PA-C    Allergies Patient has no known allergies.   REVIEW OF SYSTEMS  Negative except as noted here or in the History of Present Illness.   PHYSICAL EXAMINATION  Initial Vital Signs Blood pressure (!) 147/76, pulse 70, temperature  98.1 F (36.7 C), temperature source Oral, resp. rate 20, weight 99.8 kg (220 lb 1.6 oz), SpO2 100 %.  Examination General: Well-developed, well-nourished male in no acute distress; appearance consistent with age of record HENT: normocephalic; atraumatic Eyes: pupils equal, round and reactive to light; extraocular muscles intact Neck: supple Heart: regular rate and rhythm Lungs: clear to auscultation bilaterally Abdomen: soft; nondistended; nontender; bowel sounds present Extremities: No deformity; full range of motion except left hand limited by hematoma; pulses normal; hematoma dorsal left hand with overlying abrasion, left hand distally neurovascularly intact with intact tendon function:    Neurologic: Awake, alert and oriented; motor function intact in all extremities and symmetric; no facial droop Skin: Warm and dry; left hand hematoma and abrasion as noted above Psychiatric: Normal mood and affect   RESULTS  Summary of this visit's results, reviewed by myself:   EKG Interpretation  Date/Time:    Ventricular Rate:    PR Interval:    QRS Duration:   QT Interval:    QTC Calculation:   R Axis:     Text Interpretation:        Laboratory Studies: No results found for this or any previous visit (from the past 24 hour(s)). Imaging Studies: Dg Hand Complete Left  Result Date: 04/09/2017 CLINICAL DATA:  [place] while vacuuming, caught self with LEFT hand, swelling LEFT hand, laceration to second/ third MCP joints, unable to move much due to swelling EXAM: LEFT HAND - COMPLETE 3+ VIEW COMPARISON:  None FINDINGS: Diffuse soft tissue swelling greatest over the dorsum of the hand of the level of the distal metacarpals and MCP joints. Bones demineralized. Scattered narrowing of interphalangeal joints and STT joint. Spurring at second and third MCP joints. No acute fracture, dislocation, or bone destruction. IMPRESSION: Scattered degenerative changes and significant soft tissue swelling  without definite acute bony abnormalities. Electronically Signed   By: Lavonia Dana M.D.   On: 04/09/2017 20:33    ED COURSE  Nursing notes and initial vitals signs, including pulse oximetry, reviewed.  Vitals:   04/09/17 2004  BP: (!) 147/76  Pulse: 70  Resp: 20  Temp: 98.1 F (36.7 C)  TempSrc: Oral  SpO2: 100%  Weight: 99.8 kg (220 lb 1.6 oz)    PROCEDURES    ED DIAGNOSES     ICD-10-CM   1. Fall on steps, initial encounter W10.8XXA   2. Traumatic hematoma of left hand, initial encounter S60.222A   3. Abrasion of left hand, initial encounter T70.177L        Shanon Rosser, MD 04/09/17 2317

## 2017-04-21 DIAGNOSIS — S60221D Contusion of right hand, subsequent encounter: Secondary | ICD-10-CM | POA: Diagnosis not present

## 2017-04-27 ENCOUNTER — Encounter: Payer: Self-pay | Admitting: Cardiovascular Disease

## 2017-04-27 ENCOUNTER — Ambulatory Visit: Payer: PPO | Admitting: Cardiovascular Disease

## 2017-04-27 VITALS — BP 130/70 | HR 54 | Ht 67.0 in | Wt 220.1 lb

## 2017-04-27 DIAGNOSIS — I2510 Atherosclerotic heart disease of native coronary artery without angina pectoris: Secondary | ICD-10-CM

## 2017-04-27 DIAGNOSIS — E785 Hyperlipidemia, unspecified: Secondary | ICD-10-CM | POA: Diagnosis not present

## 2017-04-27 NOTE — Progress Notes (Signed)
Cardiology Office Note Date:  04/27/2017   ID:  Fransisco Messmer, DOB [birth year], MRN [id number]  PCP:  Fransisca Connors, PA-C  Cardiologist:  [name], MD    Chief Complaint  Patient presents with  . Shortness of Breath     History of Present Illness: Lance Hancock is a 77 y.o. male who presents for follow-up of coronary artery disease. The patient presented 08/15/2015 with an inferior STEMI. He was treated with primary PCI using overlapping drug-eluting stents in the right coronary artery. He was noted to have mild nonobstructive CAD elsewhere. Follow-up echocardiogram demonstrated preserved LV systolic function.  The patient is here alone today. Overall doing well. Has some shortness of breath when using the stair climber, otherwise no complaints. No DOE with the treadmill or other activities. No chest pain or pressure, edema, palpitations, orthopnea, or PND.   Past Medical History:  Diagnosis Date  . CAD (coronary artery disease)    a. STEMI 08/2015 >> LHC:  oLM 30, mLAD 30, pLCx 25, pRCA 99, EF 55-60 inf HK >> PCI:  3.5x56m Promus DES to prox RCA, 3.0x248mPromus DES to prox RCA.   . Marland Kitchenistory of acute inferior wall MI 08/15/2015  . History of echocardiogram    a. Echo 5/17: EF 55-60%, normal wall motion, normal diastolic function, MAC, moderate LAE  . History of nuclear stress test    Myoview 5/18: Medium size, moderate intensity fixed septal defect - likely LBBB-related. No reversible ischemia. LVEF 53% with normal wall motion. This is a low risk study.  . Marland KitchenLD (hyperlipidemia)     Past Surgical History:  Procedure Laterality Date  . BREAST SURGERY     age 89 . Marland KitchenARDIAC CATHETERIZATION N/A 08/15/2015   Procedure: Left Heart Cath and Coronary Angiography;  Surgeon: MiSherren MochaMD;  [facility];  Service: Cardiovascular;  Laterality: N/A;  . CARDIAC CATHETERIZATION N/A 08/15/2015   Procedure: Coronary Stent Intervention;  Surgeon: MiSherren MochaMD;   [facility];  Service: Cardiovascular;  Laterality: N/A;  Proximal RCA  (3.5/16 and 3.0/20 Promus)  . EYE SURGERY      Current Outpatient Medications  Medication Sig Dispense Refill  . aspirin 81 MG tablet Take 81 mg by mouth daily.    . Marland Kitchentorvastatin (LIPITOR) 80 MG tablet Take 1 tablet (80 mg total) by mouth daily at 6 PM. 90 tablet 3  . Coenzyme Q10 100 MG capsule Take 100 mg by mouth 2 (two) times daily.    . Marland KitchenLUCOSAMINE-CHONDROITIN PO Take 1,200 mg by mouth daily.    . metoprolol tartrate (LOPRESSOR) 25 MG tablet Take 0.5 tablets (12.5 mg total) by mouth 2 (two) times daily. 90 tablet 3  . Multiple Vitamin (MULTIVITAMIN) tablet Takes 1 tablespoon  Reliv vitamin And herbs by mouth daily    . nitroGLYCERIN (NITROSTAT) 0.4 MG SL tablet Place 1 tablet (0.4 mg total) under the tongue every 5 (five) minutes x 3 doses as needed for chest pain. 25 tablet 3  . Omega-3 Fatty Acids (FISH OIL ULTRA) 1400 MG CAPS Take 1,400 mg by mouth 2 (two) times daily.    . ticagrelor (BRILINTA) 90 MG TABS tablet Take 1 tablet (90 mg total) by mouth 2 (two) times daily. 180 tablet 3   No current facility-administered medications for this visit.     Allergies:   Patient has no known allergies.   Social History:  The patient  reports that he quit smoking about 41 years  ago. His smoking use included cigarettes. He has a 36.00 pack-year smoking history. He has quit using smokeless tobacco. He reports that he drinks alcohol. He reports that he does not use drugs.   Family History:  The patient's  family history includes Heart disease in his sister; Hyperlipidemia in his mother; Hypertension in his mother.    ROS:  Please see the history of present illness.  Otherwise, review of systems is positive for easy bruising.  All other systems are reviewed and negative.    PHYSICAL EXAM: VS:  BP 130/70   Pulse (!) 54   Ht _0  (1.702 m)   Wt 220 lb 1.9 oz (99.8 kg)   SpO2 94%   BMI 34.48 kg/m  ,  BMI Body mass index is 34.48 kg/m. GEN: Well nourished, well developed, in no acute distress  HEENT: normal  Neck: no JVD, no masses. No carotid bruits Cardiac: RRR with 2/6 early peaking systolic murmur at the apex                Respiratory:  clear to auscultation bilaterally, normal work of breathing GI: soft, nontender, nondistended, + BS MS: no deformity or atrophy  Ext: no pretibial edema, pedal pulses 2+= bilaterally Skin: warm and dry, no rash Neuro:  Strength and sensation are intact Psych: euthymic mood, full affect  EKG:  EKG is ordered today. The ekg ordered today shows sinus bradycardia 54 bpm, LBBB  Recent Labs: No results found for requested labs within last 8760 hours.   Lipid Panel     Component Value Date/Time   CHOL 138 08/15/2015 0351   TRIG 26 08/15/2015 0351   HDL 43 08/15/2015 0351   CHOLHDL 3.2 08/15/2015 0351   VLDL 5 08/15/2015 0351   LDLCALC 90 08/15/2015 0351      Wt Readings from Last 3 Encounters:  04/27/17 220 lb 1.9 oz (99.8 kg)  04/09/17 220 lb 1.6 oz (99.8 kg)  09/09/16 209 lb (94.8 kg)     Cardiac Studies Reviewed: 2-D echocardiogram 09/11/2015: Study Conclusions  - Left ventricle: The cavity size was normal. Systolic function was  normal. The estimated ejection fraction was in the range of 55%  to 60%. Wall motion was normal; there were no regional wall  motion abnormalities. Left ventricular diastolic function  parameters were normal. - Mitral valve: Calcified annulus. Mildly thickened leaflets . - Left atrium: The atrium was moderately dilated. - Atrial septum: No defect or patent foramen ovale was identified.  Cardiac catheterization 08/15/2015:    Conclusion    Prox RCA lesion, 99% stenosed. Post intervention, there is a 0% residual stenosis.  Ost LM lesion, 30% stenosed.  There is mild left ventricular systolic dysfunction.  Mid LAD to Dist LAD lesion, 30% stenosed.  Prox Cx to Mid Cx lesion, 25%  stenosed.  1. Subtotal occlusion of the RCA with TIMI 2 flow, treated successfully with overlapping drug-eluting stents in the proximal vessel 2. Mild nonobstructive plaque in the left main and LAD 3. Mild segmental LV contraction abnormality with preserved overall LV function   Nuclear Stress Test 09-09-2016: Study Highlights     Nuclear stress EF: 53%.  There was no ST segment deviation noted during stress.  No T wave inversion was noted during stress.  Defect 1: There is a medium defect of moderate severity.  This is a low risk study.   Medium size, moderate intensity fixed septal defect - likely LBBB-related. No reversible ischemia. LVEF 53% with normal wall  motion. This is a low risk study.     ASSESSMENT AND PLAN: 1.  CAD, native vessel, without angina: pt approaching 2 years out from his MI. Discussed pros and cons of long-term DAPT. Advised he can stop ticagrelor when he runs out of his current supply based on current guidelines. Otherwise continue current Rx.   2. Hyperlipidemia: followed by PCP. On atorvastatin 80 mg. Lifestyle modification discussed.   3. LBBB: LVEF normal and negative pharmacologic myoview after diagnosed with LBBB. Continue to follow.   Current medicines are reviewed with the patient today.  The patient does not have concerns regarding medicines.  Labs/ tests ordered today include:   Orders Placed This Encounter  Procedures  . EKG 12-Lead    Disposition:   FU one year  Signed, [name], MD  04/27/2017 10:35 AM    [address], [place], [place]  [address] Phone: [phone]; Fax: [phone]

## 2017-04-27 NOTE — Patient Instructions (Signed)
Medication Instructions:  STOP BRILINTA when this bottle is out.   Labwork: None  Testing/Procedures: None  Follow-Up: Your provider wants you to follow-up in: 1 year with Dr. Burt Knack. You will receive a reminder letter in the mail two months in advance. If you don't receive a letter, please call our office to schedule the follow-up appointment.    Any Other Special Instructions Will Be Listed Below (If Applicable).     If you need a refill on your cardiac medications before your next appointment, please call your pharmacy.

## 2017-07-13 ENCOUNTER — Telehealth: Payer: Self-pay

## 2017-07-13 NOTE — Telephone Encounter (Signed)
-----   Message from Theodoro Parma, RN sent at 04/27/2017  3:26 PM EST ----- Regarding: take brilinta out of med list Patient to stop brilinta when he runs out- he had 3 months (minus 2 weeks) left at [place] 1/14

## 2017-07-13 NOTE — Telephone Encounter (Signed)
Per Dr. Antionette Char last OV note, "1.  CAD, native vessel, without angina: pt approaching 2 years out from his MI. Discussed pros and cons of long-term DAPT. Advised he can stop ticagrelor when he runs out of his current supply based on current guidelines. Otherwise continue current Rx. "   Patient was instructed to stop when he ran out of [place]. At the time, he had 3 months minus 2 weeks of Brilinta left. Brilinta taken out of medication list.

## 2017-07-14 DIAGNOSIS — D229 Melanocytic nevi, unspecified: Secondary | ICD-10-CM | POA: Diagnosis not present

## 2017-07-14 DIAGNOSIS — D485 Neoplasm of uncertain behavior of skin: Secondary | ICD-10-CM | POA: Diagnosis not present

## 2017-07-23 DIAGNOSIS — N399 Disorder of urinary system, unspecified: Secondary | ICD-10-CM | POA: Diagnosis not present

## 2017-08-25 ENCOUNTER — Other Ambulatory Visit: Payer: Self-pay | Admitting: Physician Assistant

## 2017-08-25 DIAGNOSIS — I25119 Atherosclerotic heart disease of native coronary artery with unspecified angina pectoris: Secondary | ICD-10-CM

## 2017-09-09 DIAGNOSIS — E119 Type 2 diabetes mellitus without complications: Secondary | ICD-10-CM | POA: Diagnosis not present

## 2017-09-11 DIAGNOSIS — R369 Urethral discharge, unspecified: Secondary | ICD-10-CM | POA: Diagnosis not present

## 2017-09-11 DIAGNOSIS — E782 Mixed hyperlipidemia: Secondary | ICD-10-CM | POA: Diagnosis not present

## 2017-09-11 DIAGNOSIS — R972 Elevated prostate specific antigen [PSA]: Secondary | ICD-10-CM | POA: Diagnosis not present

## 2017-09-11 DIAGNOSIS — Z0000 Encounter for general adult medical examination without abnormal findings: Secondary | ICD-10-CM | POA: Diagnosis not present

## 2017-10-30 ENCOUNTER — Other Ambulatory Visit: Payer: Self-pay | Admitting: Physician Assistant

## 2017-10-30 DIAGNOSIS — I25119 Atherosclerotic heart disease of native coronary artery with unspecified angina pectoris: Secondary | ICD-10-CM

## 2018-03-15 DIAGNOSIS — H52203 Unspecified astigmatism, bilateral: Secondary | ICD-10-CM | POA: Diagnosis not present

## 2018-03-15 DIAGNOSIS — H25013 Cortical age-related cataract, bilateral: Secondary | ICD-10-CM | POA: Diagnosis not present

## 2018-03-15 DIAGNOSIS — H524 Presbyopia: Secondary | ICD-10-CM | POA: Diagnosis not present

## 2018-03-15 DIAGNOSIS — H43393 Other vitreous opacities, bilateral: Secondary | ICD-10-CM | POA: Diagnosis not present

## 2018-03-15 DIAGNOSIS — R7301 Impaired fasting glucose: Secondary | ICD-10-CM | POA: Diagnosis not present

## 2018-03-15 DIAGNOSIS — E782 Mixed hyperlipidemia: Secondary | ICD-10-CM | POA: Diagnosis not present

## 2018-03-15 DIAGNOSIS — I2510 Atherosclerotic heart disease of native coronary artery without angina pectoris: Secondary | ICD-10-CM | POA: Diagnosis not present

## 2018-03-15 DIAGNOSIS — S46212A Strain of muscle, fascia and tendon of other parts of biceps, left arm, initial encounter: Secondary | ICD-10-CM | POA: Diagnosis not present

## 2018-03-15 DIAGNOSIS — H2513 Age-related nuclear cataract, bilateral: Secondary | ICD-10-CM | POA: Diagnosis not present

## 2018-03-15 DIAGNOSIS — M79671 Pain in right foot: Secondary | ICD-10-CM | POA: Diagnosis not present

## 2018-03-15 DIAGNOSIS — R030 Elevated blood-pressure reading, without diagnosis of hypertension: Secondary | ICD-10-CM | POA: Diagnosis not present

## 2018-03-15 DIAGNOSIS — M67441 Ganglion, right hand: Secondary | ICD-10-CM | POA: Diagnosis not present

## 2018-03-15 DIAGNOSIS — M19071 Primary osteoarthritis, right ankle and foot: Secondary | ICD-10-CM | POA: Diagnosis not present

## 2018-03-15 DIAGNOSIS — H5213 Myopia, bilateral: Secondary | ICD-10-CM | POA: Diagnosis not present

## 2018-03-24 ENCOUNTER — Ambulatory Visit: Payer: PPO | Admitting: Physician Assistant

## 2018-04-02 ENCOUNTER — Encounter: Payer: Self-pay | Admitting: Physician Assistant

## 2018-04-27 DIAGNOSIS — I447 Left bundle-branch block, unspecified: Secondary | ICD-10-CM | POA: Insufficient documentation

## 2018-04-27 NOTE — Progress Notes (Signed)
Cardiology Office Note:    Date:  04/28/2018   ID:  Lance Hancock, DOB November 25, 1940, MRN [id number]  PCP:  Larene Beach, MD  Cardiologist:  [name], MD   Electrophysiologist:  None   Referring MD: Fransisca Connors, PA-C   Chief Complaint  Patient presents with  . Follow-up    CAD     History of Present Illness:    Lance Hancock is a 78 y.o. male with CAD s/p inferior STEMI in 5/17 treated with a DES to the RCA, HL.  He was last seen by Dr. Burt Knack in 04/2017.      Mr. Kabat returns for follow-up on coronary artery disease.  Over the past couple of months, he has started to notice fatigue.  He also has occasional tightness in his chin.  This usually occurs with activity.  However, he can also exert himself without symptoms.  It reminds him somewhat of his anginal symptoms prior to his heart attack.  However, his anginal symptoms prior to his heart attack were more consistent in occurring with exertion.  He has not had chest discomfort.  He may be more short of breath when he exercises on the treadmill and stepper.  He denies orthopnea, paroxysmal nocturnal dyspnea.  He does have chronic leg swelling without significant change.  He denies syncope.  His legs feel tired when he exercises.  Prior CV studies:   The following studies were reviewed today:  Nuclear stress test 09/09/16 Medium size, moderate intensity fixed septal defect - likely LBBB-related. No reversible ischemia. LVEF 53% with normal wall motion. This is a low risk study.  Echo 09/11/15 EF 55-60, normal wall motion, normal diastolic function, MAC, moderate LAE  LHC 08/15/15 LM ostial 30% LAD mid 30% LCx proximal 25% RCA proximal 99% EF 55-60%, mild inferior wall hypokinesis PCI: 3 x 20 mm Promus premier DES, 3.5 x 16 mm Promus premier DES overlapping to the proximal RCA  Past Medical History:  Diagnosis Date  . CAD (coronary artery disease)    a. STEMI 08/2015 >> LHC:  oLM 30, mLAD 30, pLCx 25, pRCA 99, EF  55-60 inf HK >> PCI:  3.5x62m Promus DES to prox RCA, 3.0x239mPromus DES to prox RCA.   . Marland Kitchenistory of acute inferior wall MI 08/15/2015  . History of echocardiogram    a. Echo 5/17: EF 55-60%, normal wall motion, normal diastolic function, MAC, moderate LAE  . History of nuclear stress test    Myoview 5/18: Medium size, moderate intensity fixed septal defect - likely LBBB-related. No reversible ischemia. LVEF 53% with normal wall motion. This is a low risk study.  . Marland KitchenLD (hyperlipidemia)    Surgical Hx: The patient  has a past surgical history that includes Eye surgery; Breast surgery; Cardiac catheterization (N/A, 08/15/2015); and Cardiac catheterization (N/A, 08/15/2015).   Current Medications: Current Meds  Medication Sig  . aspirin 81 MG tablet Take 81 mg by mouth daily.  . Marland Kitchentorvastatin (LIPITOR) 80 MG tablet TAKE 1 TABLET(80 MG) BY MOUTH DAILY AT 6 PM  . Coenzyme Q10 100 MG capsule Take 100 mg by mouth 2 (two) times daily.  . Marland KitchenLDERBERRY PO Take 1 tablespoon by mouth once daily  . GLUCOSAMINE-CHONDROITIN PO Take 1,200 mg by mouth daily.  . metoprolol tartrate (LOPRESSOR) 25 MG tablet TAKE 1/2 TABLET(12.5 MG) BY MOUTH TWICE DAILY  . Multiple Vitamin (MULTIVITAMIN) tablet Takes 1 tablespoon  Reliv vitamin And herbs by mouth daily  . nitroGLYCERIN (NITROSTAT) 0.4 MG SL tablet  Place 1 tablet (0.4 mg total) under the tongue every 5 (five) minutes x 3 doses as needed for chest pain.  . Omega-3 Fatty Acids (FISH OIL ULTRA) 1400 MG CAPS Take 1,400 mg by mouth 2 (two) times daily.     Allergies:   Patient has no known allergies.   Social History   Tobacco Use  . Smoking status: Former Smoker    Packs/day: 2.00    Years: 18.00    Pack years: 36.00    Types: Cigarettes    Last attempt to quit: 01/19/1976    Years since quitting: 42.3  . Smokeless tobacco: Former Network engineer Use Topics  . Alcohol use: Yes    Comment: occasional  . Drug use: No     Family Hx: The patient's family  history includes Heart disease in his sister; Hyperlipidemia in his mother; Hypertension in his mother.  ROS:   Please see the history of present illness.    ROS All other systems reviewed and are negative.   EKGs/Labs/Other Test Reviewed:    EKG:  EKG is   ordered today.  The ekg ordered today demonstrates sinus bradycardia, heart rate 57, left bundle branch block, no change from prior tracing  Recent Labs:    Recent Lipid Panel Lab Results  Component Value Date/Time   CHOL 138 08/15/2015 03:51 AM   TRIG 26 08/15/2015 03:51 AM   HDL 43 08/15/2015 03:51 AM   CHOLHDL 3.2 08/15/2015 03:51 AM   LDLCALC 90 08/15/2015 03:51 AM   03/15/18 ([facility]) Sodium 137 135 - 146 MMOL/L  Potassium 3.9  Comment: NO VISIBLE HEMOLYSIS 3.5 - 5.3 MMOL/L  Chloride 103 98 - 110 MMOL/L  CO2 27 23 - 30 MMOL/L  BUN 15 8 - 24 MG/DL  Glucose 110 (H) 70 - 99 MG/DL  Creatinine 0.83 0.5 - 1.5 MG/DL  Calcium 8.9 8.5 - 10.5 MG/DL  Total Protein 6.4 6 - 8.3 G/DL  Albumin  4.2 3.5 - 5 G/DL  Total Bilirubin 1.1 0.1 - 1.2 MG/DL  Alkaline Phosphatase 44 25 - 125 IU/L or U/L  AST (SGOT) 20 5 - 40 IU/L or U/L  ALT (SGPT) 24 5 - 50 IU/L or U/L    09/09/17 ([facility]) LDL Direct 78 <130 mg/dL  Total Cholesterol 152 25 - 199 MG/DL  Triglycerides 172 (H) 10 - 150 MG/DL  HDL Cholesterol 42 35 - 135 MG/DL  Total Chol / HDL Cholesterol 3.6 <4.5   Non-HDL Cholesterol 110  Comment:   TARGET: <(LDL-C TARGET + 30)MG/DL MG/DL     Physical Exam:    VS:  BP 120/60   Pulse (!) 57   Ht _0  (1.702 m)   Wt 221 lb (100.2 kg)   SpO2 96%   BMI 34.61 kg/m     Wt Readings from Last 3 Encounters:  04/28/18 221 lb (100.2 kg)  04/27/17 220 lb 1.9 oz (99.8 kg)  04/09/17 220 lb 1.6 oz (99.8 kg)     Physical Exam  Constitutional: He is oriented to person, place, and time. He appears well-developed and well-nourished.  HENT:  Head: Normocephalic and atraumatic.  Eyes: No scleral icterus.  Neck: No JVD present. No  thyromegaly present.  Cardiovascular: Normal rate and regular rhythm.  No murmur heard. Pulmonary/Chest: Effort normal. He has no rales.  Abdominal: Soft. He exhibits no distension.  Musculoskeletal:        General: No edema.  Lymphadenopathy:    He has no cervical adenopathy.  Neurological: He is alert and oriented to person, place, and time.  Skin: Skin is warm and dry.  Psychiatric: He has a normal mood and affect.    ASSESSMENT & PLAN:    Coronary artery disease involving native coronary artery of native heart with angina pectoris ([place]) History of inferior ST elevation myocardial infarction in 2017 treated with drug-eluting stent x2 to the RCA.  He had mild to moderate residual disease elsewhere.  He had a low risk nuclear stress test in 2018.  Recently, he notes fatigue as well as occasional chin tightness.  He had chin symptoms with exertion prior to his heart attack.  He is able to continue to exercise.  He denies chest pain.  He has not taken any nitroglycerin.  His ECG continues to demonstrate left bundle branch block.  We discussed the pros and cons of proceeding with stress testing versus cardiac catheterization.  I recommend that he proceed with stress testing initially.  Possible other causes of his fatigue include beta-blocker therapy, statin therapy, undiagnosed thyroid disease or anemia.  -Arrange Lexiscan Myoview  -Obtain TSH, CBC, BMP  -If stress test normal, consider trial off of beta-blocker  -If no relief off of beta-blocker, consider trial off of statin  -Given his symptoms and blood pressure, I have decided against adding long-acting nitrates  -Follow-up 4-6 weeks  Hyperlipidemia, unspecified hyperlipidemia type LDL optimal on most recent lab work.  Continue current Rx.  We may need to consider a trial off of statin to see if this improves his symptoms depending upon his above testing.  LBBB (left bundle branch block) Low risk nuclear stress test in 2018.  Given  his current symptoms, proceed with Lexiscan Myoview.  Fatigue, unspecified type   As noted, obtain BMET, CBC, TSH today.   Dispo:  Return in about 6 weeks (around 06/09/2018) for Follow up after testing, w/ Dr. Burt Knack, or Richardson Dopp, PA-C.   Medication Adjustments/Labs and Tests Ordered: Current medicines are reviewed at length with the patient today.  Concerns regarding medicines are outlined above.  Tests Ordered: Orders Placed This Encounter  Procedures  . Basic metabolic panel  . CBC  . TSH  . MYOCARDIAL PERFUSION IMAGING  . EKG 12-Lead   Medication Changes: No orders of the defined types were placed in this encounter.   Signed, Richardson Dopp, PA-C  04/28/2018 4:27 PM    [facility] [address], [place], [place]  [address] Phone: [phone]; Fax: [phone]

## 2018-04-28 ENCOUNTER — Ambulatory Visit: Payer: PPO | Admitting: Physician Assistant

## 2018-04-28 ENCOUNTER — Encounter: Payer: Self-pay | Admitting: Physician Assistant

## 2018-04-28 ENCOUNTER — Other Ambulatory Visit: Payer: Self-pay | Admitting: Physician Assistant

## 2018-04-28 VITALS — BP 120/60 | HR 57 | Ht 67.0 in | Wt 221.0 lb

## 2018-04-28 DIAGNOSIS — E785 Hyperlipidemia, unspecified: Secondary | ICD-10-CM | POA: Diagnosis not present

## 2018-04-28 DIAGNOSIS — I447 Left bundle-branch block, unspecified: Secondary | ICD-10-CM

## 2018-04-28 DIAGNOSIS — I25119 Atherosclerotic heart disease of native coronary artery with unspecified angina pectoris: Secondary | ICD-10-CM

## 2018-04-28 DIAGNOSIS — R5383 Other fatigue: Secondary | ICD-10-CM

## 2018-04-28 NOTE — Patient Instructions (Signed)
Medication Instructions:  Your physician recommends that you continue on your current medications as directed. Please refer to the Current Medication list given to you today.  If you need a refill on your cardiac medications before your next appointment, please call your pharmacy.   Lab work: TODAY: BMET, CBC. TSH  If you have labs (blood work) drawn today and your tests are completely normal, you will receive your results only by: Marland Kitchen MyChart Message (if you have MyChart) OR . A paper copy in the mail If you have any lab test that is abnormal or we need to change your treatment, we will call you to review the results.  Testing/Procedures: Your physician has requested that you have a lexiscan myoview. For further information please visit [email or web address]. Please follow instruction sheet, as given.    Follow-Up: At [facility], you and your health needs are our priority.  As part of our continuing mission to provide you with exceptional heart care, we have created designated Provider Care Teams.  These Care Teams include your primary Cardiologist (physician) and Advanced Practice Providers (APPs -  Physician Assistants and Nurse Practitioners) who all work together to provide you with the care you need, when you need it. You will need a follow up appointment in:  4 - 6 weeks.  Please call our office 2 months in advance to schedule this appointment.  You may see [name], MD or Richardson Dopp, PA-C when Dr. Burt Knack is in the office.  Any Other Special Instructions Will Be Listed Below (If Applicable).

## 2018-04-29 ENCOUNTER — Telehealth: Payer: Self-pay | Admitting: *Deleted

## 2018-04-29 LAB — BASIC METABOLIC PANEL
BUN / CREAT RATIO: 17 (ref 10–24)
BUN: 17 mg/dL (ref 8–27)
CO2: 23 mmol/L (ref 20–29)
CREATININE: 1.03 mg/dL (ref 0.76–1.27)
Calcium: 9.7 mg/dL (ref 8.6–10.2)
Chloride: 100 mmol/L (ref 96–106)
GFR, EST AFRICAN AMERICAN: 81 mL/min/{1.73_m2} (ref >59)
GFR, EST NON AFRICAN AMERICAN: 70 mL/min/{1.73_m2} (ref >59)
Glucose: 98 mg/dL (ref 65–99)
Potassium: 4.1 mmol/L (ref 3.5–5.2)
Sodium: 140 mmol/L (ref 134–144)

## 2018-04-29 LAB — CBC
HEMATOCRIT: 43.8 % (ref 37.5–51.0)
HEMOGLOBIN: 15.4 g/dL (ref 13.0–17.7)
MCH: 31.2 pg (ref 26.6–33.0)
MCHC: 35.2 g/dL (ref 31.5–35.7)
MCV: 89 fL (ref 79–97)
Platelets: 182 10*3/uL (ref 150–450)
RBC: 4.94 x10E6/uL (ref 4.14–5.80)
RDW: 13.1 % (ref 11.6–15.4)
WBC: 7.8 10*3/uL (ref 3.4–10.8)

## 2018-04-29 LAB — TSH: TSH: 3 u[IU]/mL (ref 0.450–4.500)

## 2018-04-29 NOTE — Telephone Encounter (Signed)
Patient notified of result.  Please refer to phone note from today for complete details.   Julaine Hua, [place] 04/29/2018 1:24 PM   Pt states to me that he was told he is supposed to f/u in a few weeks but he said he did not have an appt. I reviewed appt's for the pt. I advised I see that he does have an appt 05/25/18 @ 9:45 with Richardson Dopp, PA. I apologized to the pt that this was not put on his AVS. Pt stated he made a note of the appt in his calendar. Pt thanked me for the call.

## 2018-05-05 ENCOUNTER — Telehealth (HOSPITAL_COMMUNITY): Payer: Self-pay | Admitting: *Deleted

## 2018-05-05 NOTE — Telephone Encounter (Signed)
Left message on voicemail per DPR in reference to upcoming appointment scheduled on 05/10/18 with detailed instructions given per Myocardial Perfusion Study Information Sheet for the test. LM to arrive 15 minutes early, and that it is imperative to arrive on time for appointment to keep from having the test rescheduled. If you need to cancel or reschedule your appointment, please call the office within 24 hours of your appointment. Failure to do so may result in a cancellation of your appointment, and a $50 no show fee. Phone number given for call back for any questions. Quetzaly Ebner Jacqueline     

## 2018-05-10 ENCOUNTER — Ambulatory Visit (HOSPITAL_COMMUNITY): Payer: PPO | Attending: Surgery

## 2018-05-10 DIAGNOSIS — R5383 Other fatigue: Secondary | ICD-10-CM | POA: Diagnosis not present

## 2018-05-10 DIAGNOSIS — I25119 Atherosclerotic heart disease of native coronary artery with unspecified angina pectoris: Secondary | ICD-10-CM | POA: Diagnosis not present

## 2018-05-10 LAB — MYOCARDIAL PERFUSION IMAGING
CHL CUP NUCLEAR SRS: 0
CHL CUP NUCLEAR SSS: 2
LV dias vol: 119 mL (ref 62–150)
LVSYSVOL: 44 mL
NUC STRESS TID: 1
Peak HR: 86 {beats}/min
Rest HR: 56 {beats}/min
SDS: 2

## 2018-05-10 MED ORDER — TECHNETIUM TC 99M TETROFOSMIN IV KIT
9.7000 | PACK | Freq: Once | INTRAVENOUS | Status: AC | PRN
  Administered 2018-05-10: 9.7 via INTRAVENOUS
  Filled 2018-05-10: qty 10

## 2018-05-10 MED ORDER — REGADENOSON 0.4 MG/5ML IV SOLN
0.4000 mg | Freq: Once | INTRAVENOUS | Status: AC
  Administered 2018-05-10: 0.4 mg via INTRAVENOUS

## 2018-05-10 MED ORDER — TECHNETIUM TC 99M TETROFOSMIN IV KIT
32.3000 | PACK | Freq: Once | INTRAVENOUS | Status: AC | PRN
  Administered 2018-05-10: 32.3 via INTRAVENOUS
  Filled 2018-05-10: qty 33

## 2018-05-11 ENCOUNTER — Encounter: Payer: Self-pay | Admitting: Physician Assistant

## 2018-05-13 ENCOUNTER — Telehealth: Payer: Self-pay | Admitting: *Deleted

## 2018-05-13 NOTE — Telephone Encounter (Signed)
-----   Message from Liliane Shi, [place] sent at 05/11/2018  5:06 PM EST ----- Please call patient.  The stress test is normal.  I would like to see if he feels better off of his beta-blocker. Recommendations:  - Decrease Metoprolol to 12.5 mg once daily for 3 days then stop.  - Keep follow up as planned. Richardson Dopp, PA-C    05/11/2018 5:04 PM

## 2018-05-13 NOTE — Telephone Encounter (Signed)
Both the pt and his wife have been advised of normal Myoview results. Advised to decrease Metoprolol to 12.5 mg daily for 3 days then stop. Pt advised to keep appt 05/25/18 with Richardson Dopp, PA . Both the pt and his wife thanked me for the call.

## 2018-05-23 IMAGING — CR DG HAND COMPLETE 3+V*L*
3 series · 3 of 3 positions shown · non-contrast
Comparison: None

CLINICAL DATA: Fell while vacuuming, caught self with LEFT hand,
swelling LEFT hand, laceration to second/ third MCP joints, unable
to move much due to swelling

EXAM:
LEFT HAND - COMPLETE 3+ VIEW

[x hand pa left]
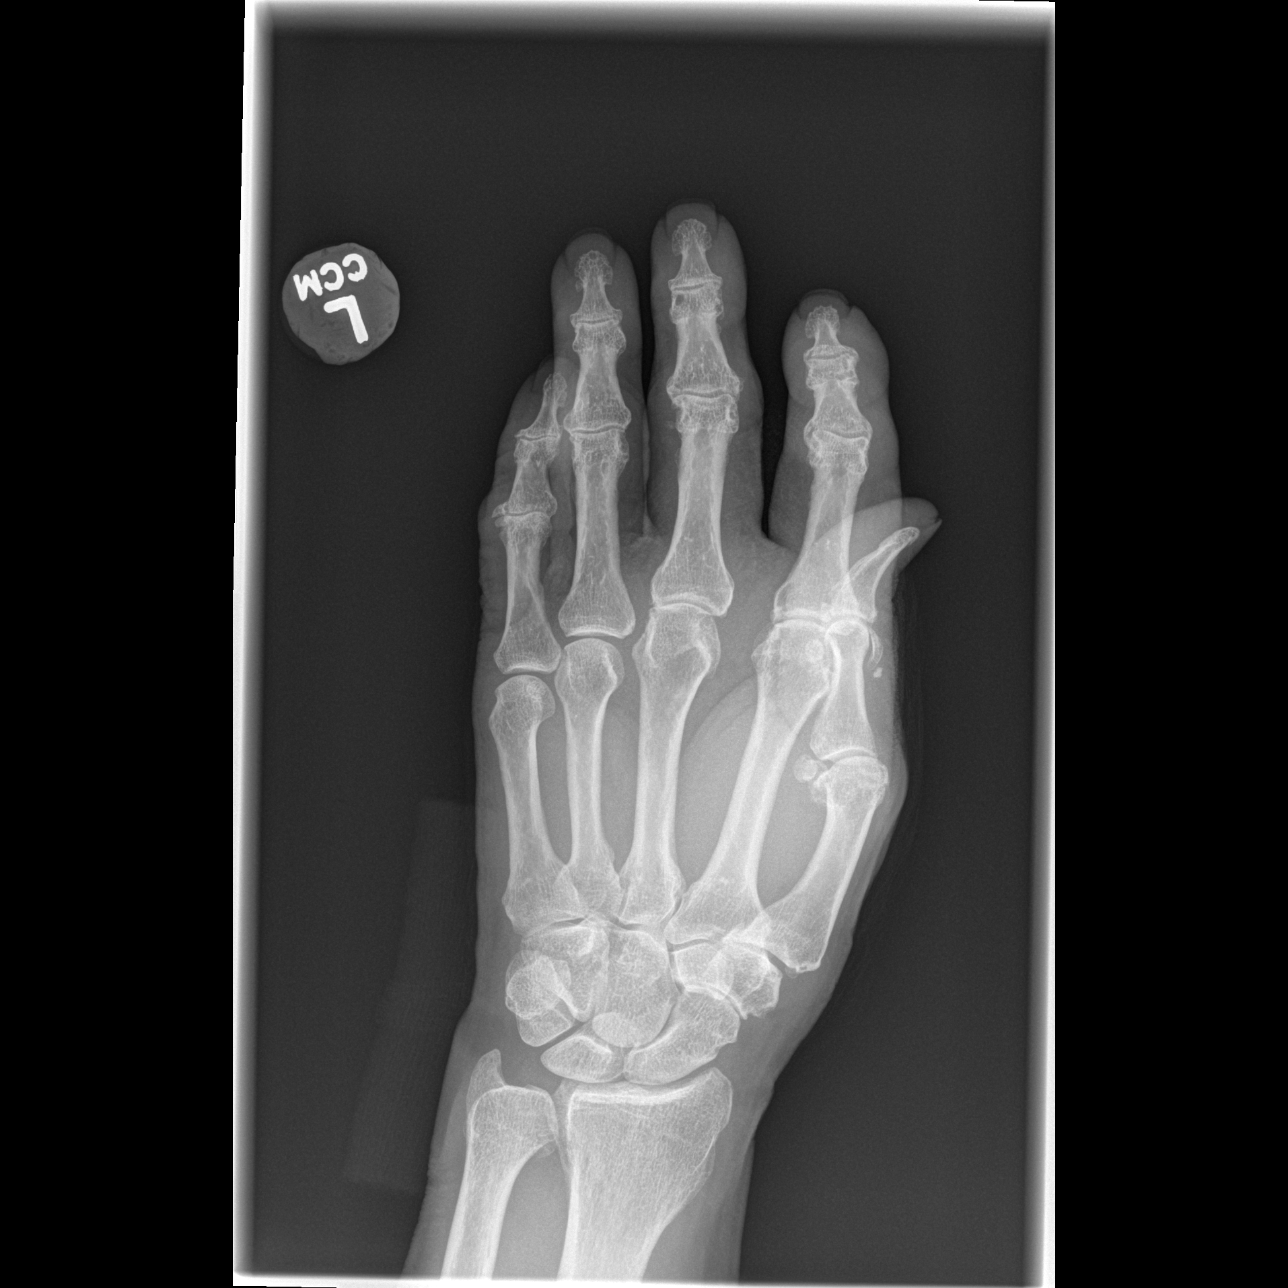

[x hand oblique left]
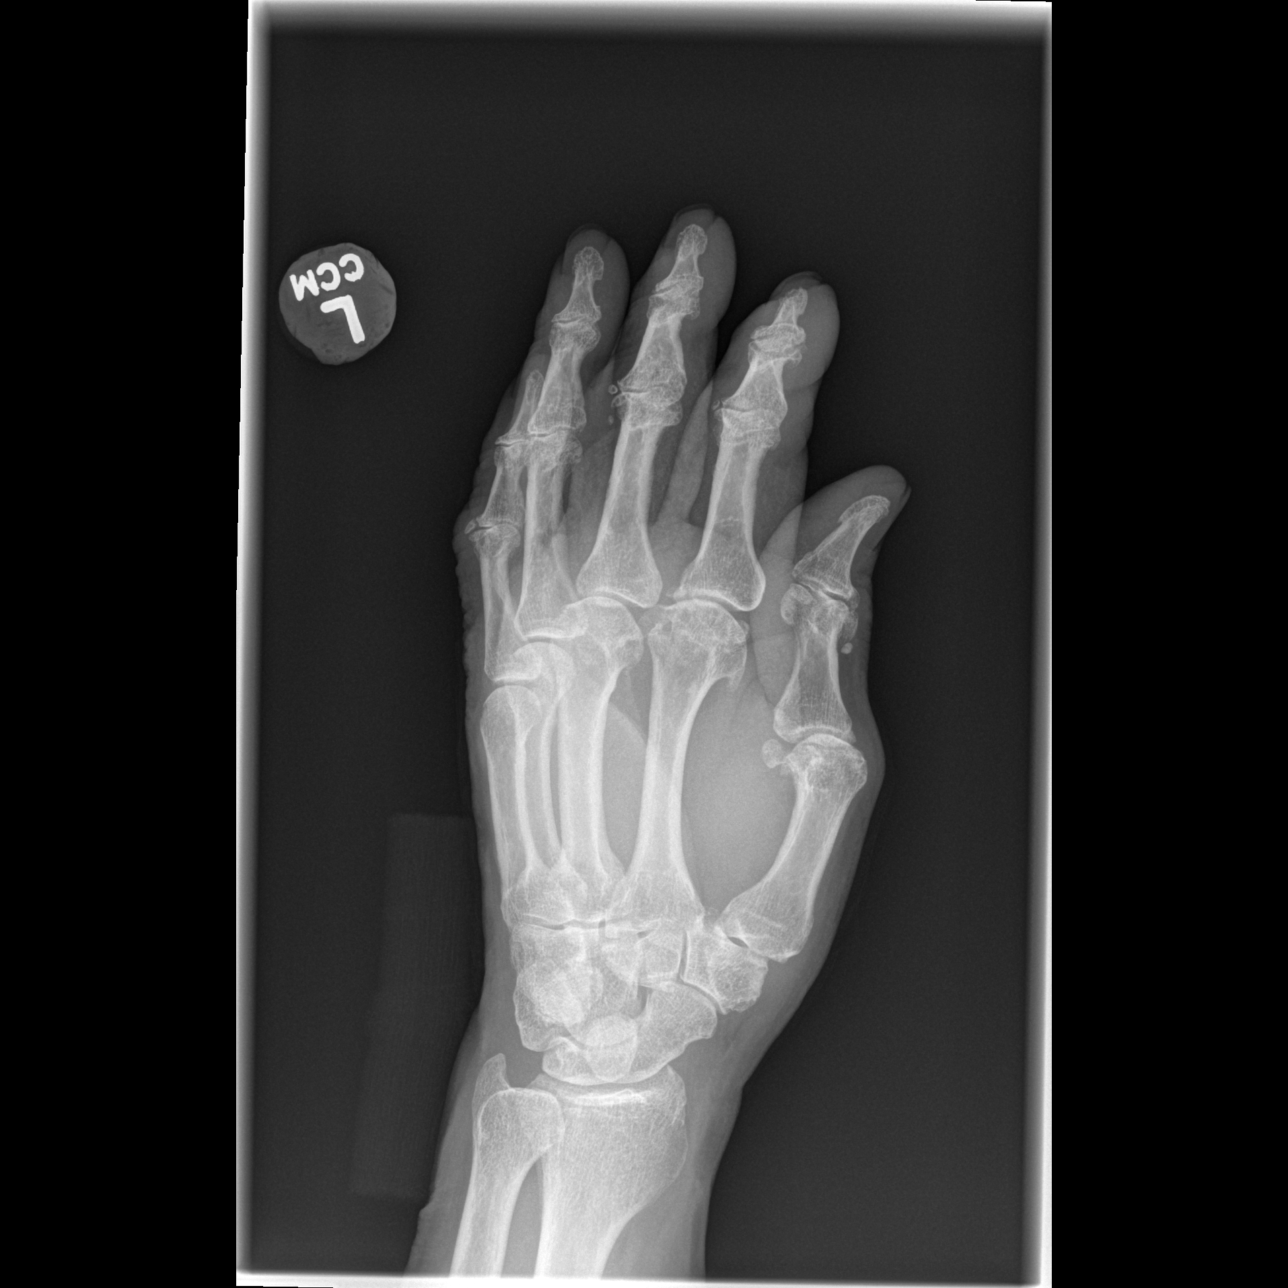

[x hand lat left]
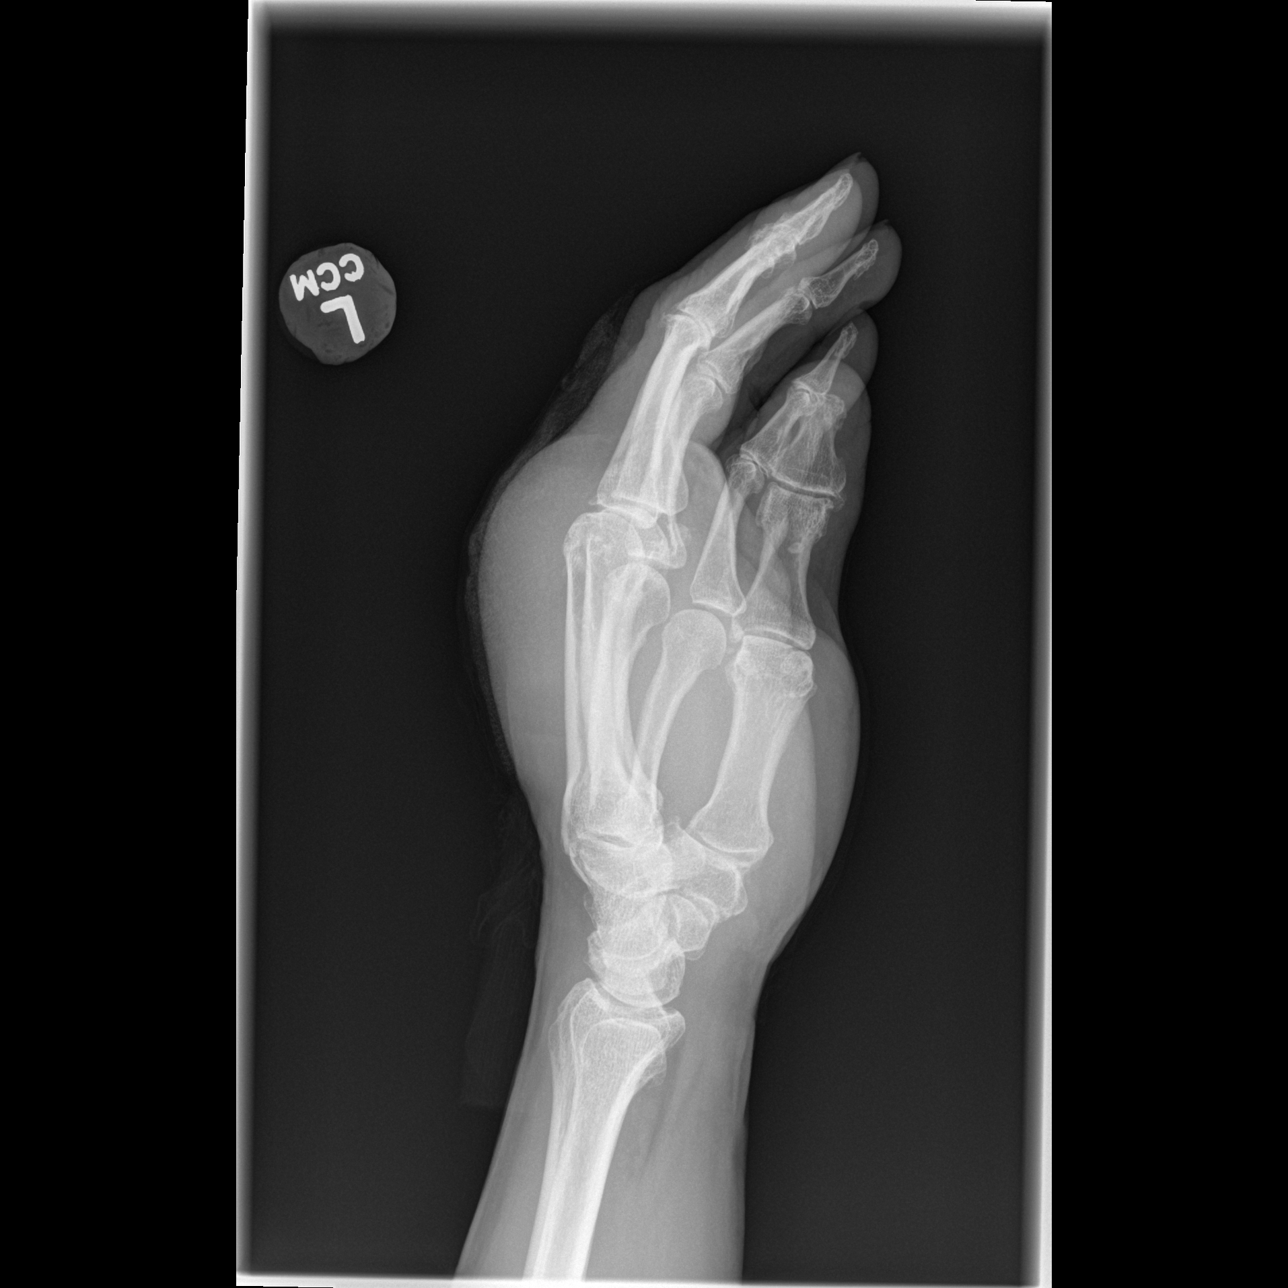

[3 of 3 positions shown; findings below may reference images not displayed]

FINDINGS: Diffuse soft tissue swelling greatest over the dorsum of the hand of
the level of the distal metacarpals and MCP joints.

Bones demineralized.

Scattered narrowing of interphalangeal joints and STT joint.

Spurring at second and third MCP joints.

No acute fracture, dislocation, or bone destruction.
IMPRESSION: Scattered degenerative changes and significant soft tissue swelling
without definite acute bony abnormalities.

## 2018-05-25 ENCOUNTER — Ambulatory Visit: Payer: PPO | Admitting: Physician Assistant

## 2018-05-25 ENCOUNTER — Encounter: Payer: Self-pay | Admitting: Physician Assistant

## 2018-05-25 VITALS — BP 112/54 | HR 76 | Ht 67.0 in | Wt 224.8 lb

## 2018-05-25 DIAGNOSIS — E785 Hyperlipidemia, unspecified: Secondary | ICD-10-CM

## 2018-05-25 DIAGNOSIS — I25119 Atherosclerotic heart disease of native coronary artery with unspecified angina pectoris: Secondary | ICD-10-CM | POA: Diagnosis not present

## 2018-05-25 NOTE — Patient Instructions (Signed)
Medication Instructions:  Your physician recommends that you continue on your current medications as directed. Please refer to the Current Medication list given to you today.  If you need a refill on your cardiac medications before your next appointment, please call your pharmacy.   Lab work: NONE If you have labs (blood work) drawn today and your tests are completely normal, you will receive your results only by: . MyChart Message (if you have MyChart) OR . A paper copy in the mail If you have any lab test that is abnormal or we need to change your treatment, we will call you to review the results.  Testing/Procedures: NONE  Follow-Up: At [facility], you and your health needs are our priority.  As part of our continuing mission to provide you with exceptional heart care, we have created designated Provider Care Teams.  These Care Teams include your primary Cardiologist (physician) and Advanced Practice Providers (APPs -  Physician Assistants and Nurse Practitioners) who all work together to provide you with the care you need, when you need it. You will need a follow up appointment in:  6 months.  Please call our office 2 months in advance to schedule this appointment.  You may see Michael Cooper, MD or one of the following Advanced Practice Providers on your designated Care Team: Scott Weaver, PA-C Vin Bhagat, PA-C . Janine Hammond, NP  Any Other Special Instructions Will Be Listed Below (If Applicable).    

## 2018-05-25 NOTE — Progress Notes (Signed)
Cardiology Office Note:    Date:  05/25/2018   ID:  Lance Hancock, DOB 12/02/1940, MRN [id number]  PCP:  Lance Beach, MD  Cardiologist:  Lance Mocha, MD  Electrophysiologist:  None   Referring MD: Lance Beach, MD   Chief Complaint  Patient presents with  . Follow-up    CAD; recent stress test     History of Present Illness:    Lance Hancock is a 78 y.o. male with CAD s/p inferior STEMI in 5/17 treated with a DES to the RCA, HL. He was last seen 04/28/2018 and complained of fatigue and exertional chin discomfort similar to his prior anginal symptoms.  A Nuclear stress test demonstrated no ischemia and normal EF.  I elected to stop his beta-blocker to see if his symptoms may be related to side effects from this drug.     Lance Hancock returns for follow up.  He is here alone.  He notes his muscle pain and muscle fatigue as well as his shortness of breath have improved since stopping the metoprolol.  He still has a lack of energy.  He exercises on a regular basis.  He has not had any chest pain, syncope, leg swelling.    Prior CV studies:   The following studies were reviewed today:  Myoview 05/11/2018 Low risk stress nuclear study with normal perfusion and normal left ventricular regional and global systolic function.  EF 63 Interestingly, although LBBB is still present, the previously reported LBBB related artifact is no longer seen.  Nuclear stress test 09/09/16 Medium size, moderate intensity fixed septal defect - likely LBBB-related. No reversible ischemia. LVEF 53% with normal wall motion. This is a low risk study.  Echo 09/11/15 EF 55-60, normal wall motion, normal diastolic function, MAC, moderate LAE  LHC 08/15/15 LM ostial 30% LAD mid 30% LCx proximal 25% RCA proximal 99% EF 55-60%, mild inferior wall hypokinesis PCI: 3 x 20 mm Promus premier DES, 3.5 x 16 mm Promus premier DES overlapping to the proximal RCA  Past Medical History:  Diagnosis Date  . CAD  (coronary artery disease)    a. STEMI 08/2015 >> LHC:  oLM 30, mLAD 30, pLCx 25, pRCA 99, EF 55-60 inf HK >> PCI:  3.5x29m Promus DES to prox RCA, 3.0x22mPromus DES to prox RCA.  // Myoview 04/2018:  Normal perfusion, EF 63; Low Risk   . History of acute inferior wall MI 08/15/2015  . History of echocardiogram    a. Echo 5/17: EF 55-60%, normal wall motion, normal diastolic function, MAC, moderate LAE  . History of nuclear stress test    Myoview 5/18: Medium size, moderate intensity fixed septal defect - likely LBBB-related. No reversible ischemia. LVEF 53% with normal wall motion. This is a low risk study.  . Marland KitchenLD (hyperlipidemia)    Surgical Hx: The patient  has a past surgical history that includes Eye surgery; Breast surgery; Cardiac catheterization (N/A, 08/15/2015); and Cardiac catheterization (N/A, 08/15/2015).   Current Medications: Current Meds  Medication Sig  . aspirin 81 MG tablet Take 81 mg by mouth daily.  . Marland Kitchentorvastatin (LIPITOR) 80 MG tablet TAKE 1 TABLET(80 MG) BY MOUTH DAILY AT 6 PM  . Coenzyme Q10 100 MG capsule Take 100 mg by mouth 2 (two) times daily.  . Marland KitchenLDERBERRY PO Take 1 tablespoon by mouth once daily  . GLUCOSAMINE-CHONDROITIN PO Take 1,200 mg by mouth daily.  . Multiple Vitamin (MULTIVITAMIN) tablet Takes 1 tablespoon  Reliv vitamin And herbs by mouth daily  .  nitroGLYCERIN (NITROSTAT) 0.4 MG SL tablet DISSOLVE 1 TABLET UNDER THE TONGUE EVERY 5 MINUTES UP TO 3 DOSES AS NEEDED FOR CHEST PAIN  . Omega-3 Fatty Acids (FISH OIL ULTRA) 1400 MG CAPS Take 1,400 mg by mouth 2 (two) times daily.     Allergies:   Patient has no known allergies.   Social History   Tobacco Use  . Smoking status: Former Smoker    Packs/day: 2.00    Years: 18.00    Pack years: 36.00    Types: Cigarettes    Last attempt to quit: 01/19/1976    Years since quitting: 42.3  . Smokeless tobacco: Former Network engineer Use Topics  . Alcohol use: Yes    Comment: occasional  . Drug use: No       Family Hx: The patient's family history includes Heart disease in his sister; Hyperlipidemia in his mother; Hypertension in his mother.  ROS:   Please see the history of present illness.    ROS All other systems reviewed and are negative.   EKGs/Labs/Other Test Reviewed:    EKG:  EKG is not ordered today.    Recent Labs: 04/28/2018: BUN 17; Creatinine, Ser 1.03; Hemoglobin 15.4; Platelets 182; Potassium 4.1; Sodium 140; TSH 3.000   Recent Lipid Panel Lab Results  Component Value Date/Time   CHOL 138 08/15/2015 03:51 AM   TRIG 26 08/15/2015 03:51 AM   HDL 43 08/15/2015 03:51 AM   CHOLHDL 3.2 08/15/2015 03:51 AM   LDLCALC 90 08/15/2015 03:51 AM   03/15/18 ([facility]) Sodium 137 135 - 146 MMOL/L  Potassium 3.9 Comment: NO VISIBLE HEMOLYSIS 3.5 - 5.3 MMOL/L  Chloride 103 98 - 110 MMOL/L  CO2 27 23 - 30 MMOL/L  BUN 15 8 - 24 MG/DL  Glucose 110 (H) 70 - 99 MG/DL  Creatinine 0.83 0.5 - 1.5 MG/DL  Calcium 8.9 8.5 - 10.5 MG/DL  Total Protein 6.4 6 - 8.3 G/DL  Albumin  4.2 3.5 - 5 G/DL  Total Bilirubin 1.1 0.1 - 1.2 MG/DL  Alkaline Phosphatase 44 25 - 125 IU/L or U/L  AST (SGOT) 20 5 - 40 IU/L or U/L  ALT (SGPT) 24 5 - 50 IU/L or U/L    09/09/17 ([facility]) LDL Direct 78 <130 mg/dL  Total Cholesterol 152 25 - 199 MG/DL  Triglycerides 172 (H) 10 - 150 MG/DL  HDL Cholesterol 42 35 - 135 MG/DL  Total Chol / HDL Cholesterol 3.6 <4.5   Non-HDL Cholesterol 110 Comment:   TARGET: <(LDL-C TARGET + 30)MG/DL      Physical Exam:    VS:  BP (!) 112/54   Pulse 76   Ht _0  (1.702 m)   Wt 224 lb 12.8 oz (102 kg)   SpO2 94%   BMI 35.21 kg/m     Wt Readings from Last 3 Encounters:  05/25/18 224 lb 12.8 oz (102 kg)  05/10/18 221 lb (100.2 kg)  04/28/18 221 lb (100.2 kg)     Physical Exam  Constitutional: He is oriented to person, place, and time. He appears well-developed and well-nourished. No distress.  HENT:  Head: Normocephalic and atraumatic.  Eyes: No scleral icterus.   Neck: No JVD present.  Cardiovascular: Normal rate and regular rhythm.  No murmur heard. Pulmonary/Chest: Effort normal. He has no rales.  Abdominal: Soft.  Musculoskeletal:        General: No edema.  Neurological: He is alert and oriented to person, place, and time.  Skin: Skin is warm and  dry.    ASSESSMENT & PLAN:    Coronary artery disease involving native coronary artery of native heart with angina pectoris ([place]) Hx of inferior STEMI in 2017 tx with DES x 2 to RCA.  Recent Myoview was low risk.  He feels better off of his beta-blocker.  He still has some fatigue.  I have recommended he continue his current Rx and continue to exercise.  If his fatigue should progress over time, we could consider other ischemic testing.  Otherwise, follow up with Dr. Burt Knack in 6 mos.  Hyperlipidemia, unspecified hyperlipidemia type LDL optimal on most recent lab work.  Continue current Rx.     Dispo:  Return in about 6 months (around 11/23/2018) for Routine Follow Up, w/ Dr. Burt Knack.   Medication Adjustments/Labs and Tests Ordered: Current medicines are reviewed at length with the patient today.  Concerns regarding medicines are outlined above.  Tests Ordered: No orders of the defined types were placed in this encounter.  Medication Changes: No orders of the defined types were placed in this encounter.   Signed, Richardson Dopp, PA-C  05/25/2018 10:33 AM    [facility] [place], [place], [place]  [address] Phone: [phone]; Fax: [phone]

## 2018-07-29 DIAGNOSIS — L821 Other seborrheic keratosis: Secondary | ICD-10-CM | POA: Diagnosis not present

## 2018-07-29 DIAGNOSIS — L814 Other melanin hyperpigmentation: Secondary | ICD-10-CM | POA: Diagnosis not present

## 2018-07-29 DIAGNOSIS — L300 Nummular dermatitis: Secondary | ICD-10-CM | POA: Diagnosis not present

## 2018-07-29 DIAGNOSIS — L218 Other seborrheic dermatitis: Secondary | ICD-10-CM | POA: Diagnosis not present

## 2018-09-20 DIAGNOSIS — R5382 Chronic fatigue, unspecified: Secondary | ICD-10-CM | POA: Diagnosis not present

## 2018-09-20 DIAGNOSIS — R61 Generalized hyperhidrosis: Secondary | ICD-10-CM | POA: Diagnosis not present

## 2018-09-20 DIAGNOSIS — R972 Elevated prostate specific antigen [PSA]: Secondary | ICD-10-CM | POA: Diagnosis not present

## 2018-09-20 DIAGNOSIS — N529 Male erectile dysfunction, unspecified: Secondary | ICD-10-CM | POA: Diagnosis not present

## 2018-09-20 DIAGNOSIS — E782 Mixed hyperlipidemia: Secondary | ICD-10-CM | POA: Diagnosis not present

## 2018-09-20 DIAGNOSIS — Z0000 Encounter for general adult medical examination without abnormal findings: Secondary | ICD-10-CM | POA: Diagnosis not present

## 2018-09-20 DIAGNOSIS — I2510 Atherosclerotic heart disease of native coronary artery without angina pectoris: Secondary | ICD-10-CM | POA: Diagnosis not present

## 2018-09-20 DIAGNOSIS — K648 Other hemorrhoids: Secondary | ICD-10-CM | POA: Diagnosis not present

## 2018-09-20 DIAGNOSIS — M791 Myalgia, unspecified site: Secondary | ICD-10-CM | POA: Diagnosis not present

## 2018-09-20 DIAGNOSIS — E119 Type 2 diabetes mellitus without complications: Secondary | ICD-10-CM | POA: Diagnosis not present

## 2018-11-29 ENCOUNTER — Encounter: Payer: Self-pay | Admitting: Cardiovascular Disease

## 2018-11-29 ENCOUNTER — Other Ambulatory Visit: Payer: Self-pay

## 2018-11-29 ENCOUNTER — Ambulatory Visit: Payer: PPO | Admitting: Cardiovascular Disease

## 2018-11-29 VITALS — BP 140/76 | HR 74 | Ht 67.0 in | Wt 220.0 lb

## 2018-11-29 DIAGNOSIS — I2510 Atherosclerotic heart disease of native coronary artery without angina pectoris: Secondary | ICD-10-CM

## 2018-11-29 DIAGNOSIS — E782 Mixed hyperlipidemia: Secondary | ICD-10-CM

## 2018-11-29 NOTE — Progress Notes (Signed)
Cardiology Office Note:    Date:  11/29/2018   ID:  Lance Hancock, DOB November 11, 1940, MRN [id number]  PCP:  Larene Beach, MD  Cardiologist:  [name], MD  Electrophysiologist:  None   Referring MD: Larene Beach, MD   Chief Complaint  Patient presents with  . Coronary Artery Disease    History of Present Illness:    Lance Hancock is a 78 y.o. male with a hx of coronary artery disease status post inferior STEMI in 2017, treated with a drug-eluting stent in the right coronary artery.  His most recent assessment for ischemia in January 2020 demonstrated normal findings when he underwent a nuclear stress test.  The patient is here alone today.  He is doing fairly well.  He walks 45 minutes to 1 hour every day.  He feels like he has some leg fatigue he thinks might be related to medicine side effect.  He has to space his walking out from his last meal.  He states that if he walks soon after eating a meal, he has a funny feeling in his epigastrium and chest.  This eases off as he continues to walk.  He has no other symptoms at this time.  He specifically denies chest pain, shortness of breath, or heart palpitations.  He has been compliant with his medications.  Past Medical History:  Diagnosis Date  . CAD (coronary artery disease)    a. STEMI 08/2015 >> LHC:  oLM 30, mLAD 30, pLCx 25, pRCA 99, EF 55-60 inf HK >> PCI:  3.5x27m Promus DES to prox RCA, 3.0x242mPromus DES to prox RCA.  // Myoview 04/2018:  Normal perfusion, EF 63; Low Risk   . History of acute inferior wall MI 08/15/2015  . History of echocardiogram    a. Echo 5/17: EF 55-60%, normal wall motion, normal diastolic function, MAC, moderate LAE  . History of nuclear stress test    Myoview 5/18: Medium size, moderate intensity fixed septal defect - likely LBBB-related. No reversible ischemia. LVEF 53% with normal wall motion. This is a low risk study.  . Marland KitchenLD (hyperlipidemia)     Past Surgical History:  Procedure  Laterality Date  . BREAST SURGERY     age 78 . Marland KitchenARDIAC CATHETERIZATION N/A 08/15/2015   Procedure: Left Heart Cath and Coronary Angiography;  Surgeon: MiSherren MochaMD;  [facility];  Service: Cardiovascular;  Laterality: N/A;  . CARDIAC CATHETERIZATION N/A 08/15/2015   Procedure: Coronary Stent Intervention;  Surgeon: MiSherren MochaMD;  [facility];  Service: Cardiovascular;  Laterality: N/A;  Proximal RCA  (3.5/16 and 3.0/20 Promus)  . EYE SURGERY      Current Medications: Current Meds  Medication Sig  . aspirin 81 MG tablet Take 81 mg by mouth daily.  . Marland Kitchentorvastatin (LIPITOR) 80 MG tablet TAKE 1 TABLET(80 MG) BY MOUTH DAILY AT 6 PM  . Coenzyme Q10 100 MG capsule Take 100 mg by mouth 2 (two) times daily.  . Marland KitchenLDERBERRY PO Take 1 tablespoon by mouth once daily  . GLUCOSAMINE-CHONDROITIN PO Take 1,200 mg by mouth daily.  . Multiple Vitamin (MULTIVITAMIN) tablet Takes 1 tablespoon  Reliv vitamin And herbs by mouth daily  . nitroGLYCERIN (NITROSTAT) 0.4 MG SL tablet DISSOLVE 1 TABLET UNDER THE TONGUE EVERY 5 MINUTES UP TO 3 DOSES AS NEEDED FOR CHEST PAIN  . Omega-3 Fatty Acids (FISH OIL ULTRA) 1400 MG CAPS Take 1,400 mg by mouth 2 (two) times daily.  Allergies:   Patient has no known allergies.   Social History   Socioeconomic History  . Marital status: Married    Spouse name: Not on file  . Number of children: Not on file  . Years of education: Not on file  . Highest education level: Not on file  Occupational History  . Not on file  Social Needs  . Financial resource strain: Not on file  . Food insecurity    Worry: Not on file    Inability: Not on file  . Transportation needs    Medical: Not on file    Non-medical: Not on file  Tobacco Use  . Smoking status: Former Smoker    Packs/day: 2.00    Years: 18.00    Pack years: 36.00    Types: Cigarettes    Quit date: 01/19/1976    Years since quitting: 42.8  . Smokeless tobacco: Former  Network engineer and Sexual Activity  . Alcohol use: Yes    Comment: occasional  . Drug use: No  . Sexual activity: Not on file  Lifestyle  . Physical activity    Days per week: Not on file    Minutes per session: Not on file  . Stress: Not on file  Relationships  . Social Herbalist on phone: Not on file    Gets together: Not on file    Attends religious service: Not on file    Active member of club or organization: Not on file    Attends meetings of clubs or organizations: Not on file    Relationship status: Not on file  Other Topics Concern  . Not on file  Social History Narrative   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     Family History: The patient's family history includes Heart disease in his sister; Hyperlipidemia in his mother; Hypertension in his mother.  ROS:   Please see the history of present illness.    All other systems reviewed and are negative.  EKGs/Labs/Other Studies Reviewed:    The following studies were reviewed today: Myoview Stress Test 05/10/2018: Study Highlights    Nuclear stress EF: 63%.  The study is normal.  This is a low risk study.   Low risk stress nuclear study with normal perfusion and normal left ventricular regional and global systolic function. Interestingly, although LBBB is still present, the previously reported LBBB related artifact is no longer seen.   Cardiac catheterization 08/15/2015: Conclusion   Prox RCA lesion, 99% stenosed. Post intervention, there is a 0% residual stenosis.  Ost LM lesion, 30% stenosed.  There is mild left ventricular systolic dysfunction.  Mid LAD to Dist LAD lesion, 30% stenosed.  Prox Cx to Mid Cx lesion, 25% stenosed.   1. Subtotal occlusion of the RCA with TIMI 2 flow, treated successfully with overlapping drug-eluting stents in the proximal vessel 2. Mild nonobstructive plaque in the left main and LAD 3. Mild segmental  LV contraction abnormality with preserved overall LV function  Recommendations: Dual antiplatelet therapy with aspirin and Brilinta for 12 months, post MI medical therapy, if stable the patient is candidate for hospital discharge in 48 hours.   Coronary Findings  Diagnostic Dominance: Right Left Main  Ost LM lesion 30% stenosed  Ost LM lesion.  Left Anterior Descending  Mid LAD to Dist LAD lesion 30% stenosed  Diffuse.  Ramus Intermedius  . Vessel is small.  Left Circumflex  Prox Cx to Mid Cx lesion 25% stenosed  Prox Cx to Mid Cx lesion.  Right Coronary Artery  Prox RCA lesion 99% stenosed  Prox RCA lesion.  Intervention  Prox RCA lesion  Angioplasty  Supplies used: STENT PROMUS PREM MR 3.0X20; STENT PROMUS PREM MR 3.5X16  PCI  The pre-interventional distal flow is decreased (TIMI 2). Pre-stent angioplasty was performed. A drug-eluting stent was placed. Post-stent angioplasty was performed. Maximum pressure: 16 atm. The post-interventional distal flow is normal (TIMI 3). The intervention was successful. No complications occurred at this lesion. There is a critical stenosis of the proximal RCA. TIMI 2 flow is present at baseline. There is associated thrombus. Heparin was used for anticoagulation. A JR4 guide catheter is used interventional procedure. A therapeutic ACT is achieved. A cougar wire was used to cross the lesion with a moderate amount of difficulty. The lesion was predilated with a 2.0 mm balloon. There is heavy thrombus visible after balloon dilatation. Aspiration thrombectomy is performed. An Aggrastat boluses administered intravenously. The patient developed severe bradycardia and hypotension after aspiration thrombectomy and required atropine 1 mg. Fluids were run wide open. He responded within a few minutes. The lesion was then stented with a 3.0 x 20 mm Promus DES deployed at 16 atm. There was some residual disease in the proximal RCA within a few millimeters of the  stented segment. The patient was treated with an overlapping 3.5 x 16 mm Promus DES proximally. The entire stented segment was then postdilated with a 3.75 mm noncompliant balloon to a maximum pressure of 14 atm. At the completion of the procedure there is TIMI-3 flow, 0% residual stenosis, and no complications.  There is a 0% residual stenosis post intervention.  Wall Motion             Left Heart  Left Ventricle The left ventricular size is normal.  There is mild left ventricular systolic dysfunction.  There are wall motion abnormalities in the left ventricle.   There are segmental wall motion abnormalities in the left ventricle.  Mild hypokinesis of the inferior wall with preserved LVEF 55-60%  Coronary Diagrams  Diagnostic Dominance: Right  Intervention    Echo 09/11/2015: Study Conclusions  - Left ventricle: The cavity size was normal. Systolic function was   normal. The estimated ejection fraction was in the range of 55%   to 60%. Wall motion was normal; there were no regional wall   motion abnormalities. Left ventricular diastolic function   parameters were normal. - Mitral valve: Calcified annulus. Mildly thickened leaflets . - Left atrium: The atrium was moderately dilated. - Atrial septum: No defect or patent foramen ovale was identified.  EKG:  EKG is not ordered today.    Recent Labs: 04/28/2018: BUN 17; Creatinine, Ser 1.03; Hemoglobin 15.4; Platelets 182; Potassium 4.1; Sodium 140; TSH 3.000  Recent Lipid Panel    Component Value Date/Time   CHOL 138 08/15/2015 0351   TRIG 26 08/15/2015 0351   HDL 43 08/15/2015 0351   CHOLHDL 3.2 08/15/2015 0351   VLDL 5 08/15/2015 0351   LDLCALC 90 08/15/2015 0351    Physical Exam:    VS:  BP 140/76   Pulse 74   Ht _0  (1.702 m)   Wt 220 lb (99.8 kg)   SpO2 97%   BMI 34.46 kg/m     Wt Readings from Last 3 Encounters:  11/29/18 220 lb (99.8 kg)  05/25/18 224 lb 12.8 oz (102 kg)  05/10/18 221 lb (100.2  kg)      GEN: Well nourished, well developed in no acute distress HEENT: Normal NECK: No JVD; No carotid bruits LYMPHATICS: No lymphadenopathy CARDIAC: RRR, no murmurs, rubs, gallops RESPIRATORY:  Clear to auscultation without rales, wheezing or rhonchi  ABDOMEN: Soft, non-tender, non-distended MUSCULOSKELETAL:  No edema; No deformity  SKIN: Warm and dry NEUROLOGIC:  Alert and oriented x 3 PSYCHIATRIC:  Normal affect   ASSESSMENT:    1. Coronary artery disease involving native coronary artery of native heart without angina pectoris   2. Mixed hyperlipidemia    PLAN:    In order of problems listed above:  1. Overall the patient appears stable on aspirin and a high intensity statin drug.  His beta-blocker was stopped at the time of his last visit secondary to side effects.  The patient has a good functional capacity and no medication changes are made today.  I will see him back in 1 year.  I reviewed his most recent Myoview scan which demonstrated normal LV function and no ischemia. 2. Lipids reviewed from his primary care physician.  LDL cholesterol is 109.  He is already concerned about the side effects of the medication.  I recommended that he continue on atorvastatin.  We briefly discussed consideration of additive therapy such as ezetimibe or PCSK9 inhibitor.  He prefers to not take any additional medication at this time.   Medication Adjustments/Labs and Tests Ordered: Current medicines are reviewed at length with the patient today.  Concerns regarding medicines are outlined above.  No orders of the defined types were placed in this encounter.  No orders of the defined types were placed in this encounter.   Patient Instructions  Medication Instructions:  Your provider recommends that you continue on your current medications as directed. Please refer to the Current Medication list given to you today.    Labwork: None  Testing/Procedures: None  Follow-Up: Your provider wants  you to follow-up in: 1 year with Dr. Burt Knack. You will receive a reminder letter in the mail two months in advance. If you don't receive a letter, please call our office to schedule the follow-up appointment.    Any Other Special Instructions Will Be Listed Below (If Applicable).     If you need a refill on your cardiac medications before your next appointment, please call your pharmacy.      Signed, [name], MD  11/29/2018 1:06 PM    [place]

## 2018-11-29 NOTE — Patient Instructions (Signed)

## 2019-03-21 DIAGNOSIS — H2513 Age-related nuclear cataract, bilateral: Secondary | ICD-10-CM | POA: Diagnosis not present

## 2019-03-21 DIAGNOSIS — H52203 Unspecified astigmatism, bilateral: Secondary | ICD-10-CM | POA: Diagnosis not present

## 2019-03-21 DIAGNOSIS — H43393 Other vitreous opacities, bilateral: Secondary | ICD-10-CM | POA: Diagnosis not present

## 2019-03-21 DIAGNOSIS — H524 Presbyopia: Secondary | ICD-10-CM | POA: Diagnosis not present

## 2019-03-21 DIAGNOSIS — H5213 Myopia, bilateral: Secondary | ICD-10-CM | POA: Diagnosis not present

## 2019-03-21 DIAGNOSIS — H25013 Cortical age-related cataract, bilateral: Secondary | ICD-10-CM | POA: Diagnosis not present

## 2019-05-07 ENCOUNTER — Other Ambulatory Visit: Payer: Self-pay | Admitting: Physician Assistant

## 2019-05-07 DIAGNOSIS — I25119 Atherosclerotic heart disease of native coronary artery with unspecified angina pectoris: Secondary | ICD-10-CM

## 2019-06-10 DIAGNOSIS — H43393 Other vitreous opacities, bilateral: Secondary | ICD-10-CM | POA: Diagnosis not present

## 2019-06-10 DIAGNOSIS — H25013 Cortical age-related cataract, bilateral: Secondary | ICD-10-CM | POA: Diagnosis not present

## 2019-06-10 DIAGNOSIS — H2513 Age-related nuclear cataract, bilateral: Secondary | ICD-10-CM | POA: Diagnosis not present

## 2019-11-24 ENCOUNTER — Telehealth: Payer: Self-pay | Admitting: Cardiovascular Disease

## 2019-11-24 NOTE — Telephone Encounter (Signed)
Patient is calling to make Dr. Burt Knack aware of symptoms he has been experiencing. He states he has been extremely tired and fatigued. He is also experiencing numbness and itchiness in his chin. Please return call to discuss.

## 2019-11-24 NOTE — Telephone Encounter (Signed)
Lance Hancock reports he is starting to have similar symptoms to when he had his heart attack several years ago. He is fatigued all the time and has a tingling in his chin and jawline. These symptoms occurred about 2 months prior to his last heart attack.  The current symptoms are mild at this time and only occur on exertion - he is just simply very aware of them when he rides his bike. Confirmed with the patient he has NTG and will keep on him.  Scheduled the patient for visit 8/25 with Ermalinda Barrios. The patient will refrain from riding his bike until that time (he will walk instead).  ER precautions reviewed. He will call if symptoms progress. He was grateful for assistance.

## 2019-12-06 NOTE — Progress Notes (Signed)
Cardiology Office Note    Date:  12/07/2019   ID:  Lance Hancock, DOB 1940/12/26, MRN [id number]  PCP:  Larene Beach, MD  Cardiologist: [name], MD EPS: None  Chief Complaint  Patient presents with  . Jaw Pain    History of Present Illness:  Lance Hancock is a 79 y.o. male with a hx of coronary artery disease status post inferior STEMI in 2017, treated with a drug-eluting stent in the right coronary artery, normal NST 04/2018.  Patient added onto my schedule today because he called in complaining of jaw pain, fatigue similar to symptoms prior to his heart attack.  Only occur on exertion when riding his bike.  Patient started riding his bike in June and did well for 6 weeks. Legs became fatigued and jaw became numb when riding up the hill and would ease when biking on flat part of 10 mile ride. More short of breath with it. He stopped riding after he made his appt. Working in yard without symptoms but legs fatigued.    Past Medical History:  Diagnosis Date  . CAD (coronary artery disease)    a. STEMI 08/2015 >> LHC:  oLM 30, mLAD 30, pLCx 25, pRCA 99, EF 55-60 inf HK >> PCI:  3.5x77m Promus DES to prox RCA, 3.0x222mPromus DES to prox RCA.  // Myoview 04/2018:  Normal perfusion, EF 63; Low Risk   . History of acute inferior wall MI 08/15/2015  . History of echocardiogram    a. Echo 5/17: EF 55-60%, normal wall motion, normal diastolic function, MAC, moderate LAE  . History of nuclear stress test    Myoview 5/18: Medium size, moderate intensity fixed septal defect - likely LBBB-related. No reversible ischemia. LVEF 53% with normal wall motion. This is a low risk study.  . Marland KitchenLD (hyperlipidemia)     Past Surgical History:  Procedure Laterality Date  . BREAST SURGERY     age 79 . Marland KitchenARDIAC CATHETERIZATION N/A 08/15/2015   Procedure: Left Heart Cath and Coronary Angiography;  Surgeon: MiSherren MochaMD;  [facility];  Service: Cardiovascular;  Laterality:  N/A;  . CARDIAC CATHETERIZATION N/A 08/15/2015   Procedure: Coronary Stent Intervention;  Surgeon: MiSherren MochaMD;  [facility];  Service: Cardiovascular;  Laterality: N/A;  Proximal RCA  (3.5/16 and 3.0/20 Promus)  . EYE SURGERY      Current Medications: Current Meds  Medication Sig  . aspirin 81 MG tablet Take 81 mg by mouth daily.  . Marland Kitchentorvastatin (LIPITOR) 80 MG tablet TAKE 1 TABLET(80 MG) BY MOUTH DAILY AT 6 PM  . Coenzyme Q10 100 MG capsule Take 100 mg by mouth 2 (two) times daily.  . Marland KitchenLDERBERRY PO Take 1 tablespoon by mouth once daily  . GLUCOSAMINE-CHONDROITIN PO Take 1,200 mg by mouth daily.  . Multiple Vitamin (MULTIVITAMIN) tablet Takes 1 tablespoon  Reliv vitamin And herbs by mouth daily  . nitroGLYCERIN (NITROSTAT) 0.4 MG SL tablet DISSOLVE 1 TABLET UNDER THE TONGUE EVERY 5 MINUTES UP TO 3 DOSES AS NEEDED FOR CHEST PAIN  . Omega-3 Fatty Acids (FISH OIL ULTRA) 1400 MG CAPS Take 1,400 mg by mouth 2 (two) times daily.     Allergies:   Patient has no known allergies.   Social History   Socioeconomic History  . Marital status: Married    Spouse name: Not on file  . Number of children: Not on file  . Years of education: Not on file  .  Highest education level: Not on file  Occupational History  . Not on file  Tobacco Use  . Smoking status: Former Smoker    Packs/day: 2.00    Years: 18.00    Pack years: 36.00    Types: Cigarettes    Quit date: 01/19/1976    Years since quitting: 43.9  . Smokeless tobacco: Former Network engineer  . Vaping Use: Never used  Substance and Sexual Activity  . Alcohol use: Yes    Comment: occasional  . Drug use: No  . Sexual activity: Not on file  Other Topics Concern  . Not on file  Social History Narrative   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   Social Determinants of Health   Financial Resource Strain:   . Difficulty of Paying Living Expenses: Not on  file  Food Insecurity:   . Worried About Charity fundraiser in the Last Year: Not on file  . Ran Out of Food in the Last Year: Not on file  Transportation Needs:   . Lack of Transportation (Medical): Not on file  . Lack of Transportation (Non-Medical): Not on file  Physical Activity:   . Days of Exercise per Week: Not on file  . Minutes of Exercise per Session: Not on file  Stress:   . Feeling of Stress : Not on file  Social Connections:   . Frequency of Communication with Friends and Family: Not on file  . Frequency of Social Gatherings with Friends and Family: Not on file  . Attends Religious Services: Not on file  . Active Member of Clubs or Organizations: Not on file  . Attends Archivist Meetings: Not on file  . Marital Status: Not on file     Family History:  The patient's family history includes Heart disease in his sister; Hyperlipidemia in his mother; Hypertension in his mother.   ROS:   Please see the history of present illness.    ROS All other systems reviewed and are negative.   PHYSICAL EXAM:   VS:  BP 140/72   Pulse 70   Ht _0  (1.702 m)   Wt 221 lb (100.2 kg)   SpO2 93%   BMI 34.61 kg/m   Physical Exam  GEN: Obese, in no acute distress  HEENT: normal  Neck: no JVD, carotid bruits, or masses Cardiac:RRR; no murmurs, rubs, or gallops  Respiratory:  clear to auscultation bilaterally, normal work of breathing GI: soft, nontender, nondistended, + BS Ext: without cyanosis, clubbing, or edema, Good distal pulses bilaterally MS: no deformity or atrophy  Skin: warm and dry, no rash Neuro:  Alert and Oriented x 3, Strength and sensation are intact Psych: euthymic mood, full affect  Wt Readings from Last 3 Encounters:  12/07/19 221 lb (100.2 kg)  11/29/18 220 lb (99.8 kg)  05/25/18 224 lb 12.8 oz (102 kg)      Studies/Labs Reviewed:   EKG:  EKG is  ordered today.  The ekg ordered today demonstrates normal sinus rhythm with left bundle branch  block  Recent Labs: No results found for requested labs within last 8760 hours.   Lipid Panel    Component Value Date/Time   CHOL 138 08/15/2015 0351   TRIG 26 08/15/2015 0351   HDL 43 08/15/2015 0351   CHOLHDL 3.2 08/15/2015 0351   VLDL 5 08/15/2015 0351   LDLCALC 90 08/15/2015 0351    Additional studies/  records that were reviewed today include:  NST 05/10/2018 Study Highlights     Nuclear stress EF: 63%.  The study is normal.  This is a low risk study.   Low risk stress nuclear study with normal perfusion and normal left ventricular regional and global systolic function. Interestingly, although LBBB is still present, the previously reported LBBB related artifact is no longer seen.     Cardiac catheterization 08/15/2015  Prox RCA lesion, 99% stenosed. Post intervention, there is a 0% residual stenosis.  Ost LM lesion, 30% stenosed.  There is mild left ventricular systolic dysfunction.  Mid LAD to Dist LAD lesion, 30% stenosed.  Prox Cx to Mid Cx lesion, 25% stenosed.   1. Subtotal occlusion of the RCA with TIMI 2 flow, treated successfully with overlapping drug-eluting stents in the proximal vessel 2. Mild nonobstructive plaque in the left main and LAD 3. Mild segmental LV contraction abnormality with preserved overall LV function   Recommendations: Dual antiplatelet therapy with aspirin and Brilinta for 12 months, post MI medical therapy, if stable the patient is candidate for hospital discharge in 48 hours.       ASSESSMENT:    1. Jaw pain   2. Coronary artery disease involving native coronary artery of native heart with angina pectoris ([place])   3. Hyperlipidemia, unspecified hyperlipidemia type      PLAN:  In order of problems listed above:  Recurrent jaw pain and leg weakness similar to when he had his MI occurs when riding uphill on his bike.  Discussed with Dr. Burt Knack who recommends reassessing with cardiac catheterization.  Will start low-dose  beta-blocker metoprolol 12.5 mg twice daily. I have reviewed the risks, indications, and alternatives to angioplasty and stenting with the patient. Risks include but are not limited to bleeding, infection, vascular injury, stroke, myocardial infection, arrhythmia, kidney injury, radiation-related injury in the case of prolonged fluoroscopy use, emergency cardiac surgery, and death. The patient understands the risks of serious complication is low (<2%) and patient agrees to proceed.     hx of coronary artery disease status post inferior STEMI in 2017, treated with a drug-eluting stent in the right coronary artery, normal NST 04/2018 now with similar anginal symptoms.  Hyperlipidemia LDL 98 09/2018 on Lipitor 80 mg once daily.  Add Zetia 10 mg once daily   Medication Adjustments/Labs and Tests Ordered: Current medicines are reviewed at length with the patient today.  Concerns regarding medicines are outlined above.  Medication changes, Labs and Tests ordered today are listed in the Patient Instructions below. Patient Instructions  Medication Instructions:  Your physician has rec ommended you make the following change in your medication:   START ZETIA 17m daily START Metoprolol Tartrate 24m1/2 tablet twice daily  *If you need a refill on your cardiac medications before your next appointment, please call your pharmacy*   Lab Work: TODAY: BMET, CBC  If you have labs (blood work) drawn today and your tests are completely normal, you will receive your results only by: . Marland KitchenyChart Message (if you have MyChart) OR . A paper copy in the mail If you have any lab test that is abnormal or we need to change your treatment, we will call you to review the results.   Testing/Procedures:    [place] [address] [id number]: [phone]: 33[place]8/25/2021  You are scheduled for a Cardiac Catheterization  on Friday, September 17 with Dr. [name].  1. Please arrive at the [facility] (Main Entrance A) at [facility]: [address] [place], [place] [address] at 5:30 AM (This time is two hours before your procedure to ensure your preparation). Free valet parking service is available.   Special note: Every effort is made to have your procedure done on time. Please understand that emergencies sometimes delay scheduled procedures.  2. Diet: Do not eat solid foods after midnight.  The patient may have clear liquids until 5am upon the day of the procedure.  3. Labs: You will need to have blood drawn today  4. Medication instructions in preparation for your procedure:   Contrast Allergy: No    Current Outpatient Medications (Cardiovascular):  .  atorvastatin (LIPITOR) 80 MG tablet, TAKE 1 TABLET(80 MG) BY MOUTH DAILY AT 6 PM .  nitroGLYCERIN (NITROSTAT) 0.4 MG SL tablet, DISSOLVE 1 TABLET UNDER THE TONGUE EVERY 5 MINUTES UP TO 3 DOSES AS NEEDED FOR CHEST PAIN .  ezetimibe (ZETIA) 10 MG tablet, Take 1 tablet (10 mg total) by mouth daily. .  metoprolol tartrate (LOPRESSOR) 25 MG tablet, Take 0.5 tablets (12.5 mg total) by mouth 2 (two) times daily.   Current Outpatient Medications (Analgesics):  .  aspirin 81 MG tablet, Take 81 mg by mouth daily.   Current Outpatient Medications (Other):  Marland Kitchen  Coenzyme Q10 100 MG capsule, Take 100 mg by mouth 2 (two) times daily. Marland Kitchen  ELDERBERRY PO, Take 1 tablespoon by mouth once daily .  GLUCOSAMINE-CHONDROITIN PO, Take 1,200 mg by mouth daily. .  Multiple Vitamin (MULTIVITAMIN) tablet, Takes 1 tablespoon  Reliv vitamin And herbs by mouth daily .  Omega-3 Fatty Acids (FISH OIL ULTRA) 1400 MG CAPS, Take 1,400 mg by mouth 2 (two) times daily.   On the morning of your procedure, take your Aspirin and any morning medicines NOT listed above.  You may use sips of water.  5. Plan  for one night stay--bring personal belongings. 6. Bring a current list of your medications and current insurance cards. 7. You MUST have a responsible person to drive you home. 8. Someone MUST be with you the first 24 hours after you arrive home or your discharge will be delayed. 9. Please wear clothes that are easy to get on and off and wear slip-on shoes.  Thank you for allowing [place] to care for you!   -- [facility]    Follow-Up: At [facility], you and your health needs are our priority.  As part of our continuing mission to provide you with exceptional heart care, we have created designated Provider Care Teams.  These Care Teams include your primary Cardiologist (physician) and Advanced Practice Providers (APPs -  Physician Assistants and Nurse Practitioners) who all work together to provide you with the care you need, when you need it.  We recommend signing up for the patient portal called "MyChart".  Sign up information is provided on this After Visit Summary.  MyChart is used to connect with patients for Virtual Visits (Telemedicine).  Patients are able to view lab/test results, encounter notes, upcoming appointments, etc.  Non-urgent messages can be sent to your provider as well.   To learn more about what you can do with MyChart, go to [email or web address].    Your next appointment:   01/18/2020  The format for your next appointment:   In Person  Provider:   Ermalinda Barrios, PA-C   Other Instructions  COVID SCREENING INFORMATION: You are scheduled for your drive-thru COVID screening on 12/27/2019 at 8:35. Pre-Procedural COVID-19 Testing Site [phone] [place], [place] [address] You will need to go home after your screening and quarantine until your procedure.       Signed, Ermalinda Barrios, PA-C  12/07/2019 2:57 PM    [facility] [address], [place], [place]  [address] Phone: [phone]; Fax: [phone]

## 2019-12-06 NOTE — H&P (View-Only) (Signed)
Cardiology Office Note    Date:  12/07/2019   ID:  Lance Hancock, DOB 1940/12/26, MRN [id number]  PCP:  Larene Beach, MD  Cardiologist: [name], MD EPS: None  Chief Complaint  Patient presents with  . Jaw Pain    History of Present Illness:  Lance Hancock is a 79 y.o. male with a hx of coronary artery disease status post inferior STEMI in 2017, treated with a drug-eluting stent in the right coronary artery, normal NST 04/2018.  Patient added onto my schedule today because he called in complaining of jaw pain, fatigue similar to symptoms prior to his heart attack.  Only occur on exertion when riding his bike.  Patient started riding his bike in June and did well for 6 weeks. Legs became fatigued and jaw became numb when riding up the hill and would ease when biking on flat part of 10 mile ride. More short of breath with it. He stopped riding after he made his appt. Working in yard without symptoms but legs fatigued.    Past Medical History:  Diagnosis Date  . CAD (coronary artery disease)    a. STEMI 08/2015 >> LHC:  oLM 30, mLAD 30, pLCx 25, pRCA 99, EF 55-60 inf HK >> PCI:  3.5x77m Promus DES to prox RCA, 3.0x222mPromus DES to prox RCA.  // Myoview 04/2018:  Normal perfusion, EF 63; Low Risk   . History of acute inferior wall MI 08/15/2015  . History of echocardiogram    a. Echo 5/17: EF 55-60%, normal wall motion, normal diastolic function, MAC, moderate LAE  . History of nuclear stress test    Myoview 5/18: Medium size, moderate intensity fixed septal defect - likely LBBB-related. No reversible ischemia. LVEF 53% with normal wall motion. This is a low risk study.  . Marland KitchenLD (hyperlipidemia)     Past Surgical History:  Procedure Laterality Date  . BREAST SURGERY     age 79 . Marland KitchenARDIAC CATHETERIZATION N/A 08/15/2015   Procedure: Left Heart Cath and Coronary Angiography;  Surgeon: MiSherren MochaMD;  [facility];  Service: Cardiovascular;  Laterality:  N/A;  . CARDIAC CATHETERIZATION N/A 08/15/2015   Procedure: Coronary Stent Intervention;  Surgeon: MiSherren MochaMD;  [facility];  Service: Cardiovascular;  Laterality: N/A;  Proximal RCA  (3.5/16 and 3.0/20 Promus)  . EYE SURGERY      Current Medications: Current Meds  Medication Sig  . aspirin 81 MG tablet Take 81 mg by mouth daily.  . Marland Kitchentorvastatin (LIPITOR) 80 MG tablet TAKE 1 TABLET(80 MG) BY MOUTH DAILY AT 6 PM  . Coenzyme Q10 100 MG capsule Take 100 mg by mouth 2 (two) times daily.  . Marland KitchenLDERBERRY PO Take 1 tablespoon by mouth once daily  . GLUCOSAMINE-CHONDROITIN PO Take 1,200 mg by mouth daily.  . Multiple Vitamin (MULTIVITAMIN) tablet Takes 1 tablespoon  Reliv vitamin And herbs by mouth daily  . nitroGLYCERIN (NITROSTAT) 0.4 MG SL tablet DISSOLVE 1 TABLET UNDER THE TONGUE EVERY 5 MINUTES UP TO 3 DOSES AS NEEDED FOR CHEST PAIN  . Omega-3 Fatty Acids (FISH OIL ULTRA) 1400 MG CAPS Take 1,400 mg by mouth 2 (two) times daily.     Allergies:   Patient has no known allergies.   Social History   Socioeconomic History  . Marital status: Married    Spouse name: Not on file  . Number of children: Not on file  . Years of education: Not on file  .  Highest education level: Not on file  Occupational History  . Not on file  Tobacco Use  . Smoking status: Former Smoker    Packs/day: 2.00    Years: 18.00    Pack years: 36.00    Types: Cigarettes    Quit date: 01/19/1976    Years since quitting: 43.9  . Smokeless tobacco: Former Network engineer  . Vaping Use: Never used  Substance and Sexual Activity  . Alcohol use: Yes    Comment: occasional  . Drug use: No  . Sexual activity: Not on file  Other Topics Concern  . Not on file  Social History Narrative   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   Social Determinants of Health   Financial Resource Strain:   . Difficulty of Paying Living Expenses: Not on  file  Food Insecurity:   . Worried About Charity fundraiser in the Last Year: Not on file  . Ran Out of Food in the Last Year: Not on file  Transportation Needs:   . Lack of Transportation (Medical): Not on file  . Lack of Transportation (Non-Medical): Not on file  Physical Activity:   . Days of Exercise per Week: Not on file  . Minutes of Exercise per Session: Not on file  Stress:   . Feeling of Stress : Not on file  Social Connections:   . Frequency of Communication with Friends and Family: Not on file  . Frequency of Social Gatherings with Friends and Family: Not on file  . Attends Religious Services: Not on file  . Active Member of Clubs or Organizations: Not on file  . Attends Archivist Meetings: Not on file  . Marital Status: Not on file     Family History:  The patient's family history includes Heart disease in his sister; Hyperlipidemia in his mother; Hypertension in his mother.   ROS:   Please see the history of present illness.    ROS All other systems reviewed and are negative.   PHYSICAL EXAM:   VS:  BP 140/72   Pulse 70   Ht _0  (1.702 m)   Wt 221 lb (100.2 kg)   SpO2 93%   BMI 34.61 kg/m   Physical Exam  GEN: Obese, in no acute distress  HEENT: normal  Neck: no JVD, carotid bruits, or masses Cardiac:RRR; no murmurs, rubs, or gallops  Respiratory:  clear to auscultation bilaterally, normal work of breathing GI: soft, nontender, nondistended, + BS Ext: without cyanosis, clubbing, or edema, Good distal pulses bilaterally MS: no deformity or atrophy  Skin: warm and dry, no rash Neuro:  Alert and Oriented x 3, Strength and sensation are intact Psych: euthymic mood, full affect  Wt Readings from Last 3 Encounters:  12/07/19 221 lb (100.2 kg)  11/29/18 220 lb (99.8 kg)  05/25/18 224 lb 12.8 oz (102 kg)      Studies/Labs Reviewed:   EKG:  EKG is  ordered today.  The ekg ordered today demonstrates normal sinus rhythm with left bundle branch  block  Recent Labs: No results found for requested labs within last 8760 hours.   Lipid Panel    Component Value Date/Time   CHOL 138 08/15/2015 0351   TRIG 26 08/15/2015 0351   HDL 43 08/15/2015 0351   CHOLHDL 3.2 08/15/2015 0351   VLDL 5 08/15/2015 0351   LDLCALC 90 08/15/2015 0351    Additional studies/  records that were reviewed today include:  NST 05/10/2018 Study Highlights     Nuclear stress EF: 63%.  The study is normal.  This is a low risk study.   Low risk stress nuclear study with normal perfusion and normal left ventricular regional and global systolic function. Interestingly, although LBBB is still present, the previously reported LBBB related artifact is no longer seen.     Cardiac catheterization 08/15/2015  Prox RCA lesion, 99% stenosed. Post intervention, there is a 0% residual stenosis.  Ost LM lesion, 30% stenosed.  There is mild left ventricular systolic dysfunction.  Mid LAD to Dist LAD lesion, 30% stenosed.  Prox Cx to Mid Cx lesion, 25% stenosed.   1. Subtotal occlusion of the RCA with TIMI 2 flow, treated successfully with overlapping drug-eluting stents in the proximal vessel 2. Mild nonobstructive plaque in the left main and LAD 3. Mild segmental LV contraction abnormality with preserved overall LV function   Recommendations: Dual antiplatelet therapy with aspirin and Brilinta for 12 months, post MI medical therapy, if stable the patient is candidate for hospital discharge in 48 hours.       ASSESSMENT:    1. Jaw pain   2. Coronary artery disease involving native coronary artery of native heart with angina pectoris ([place])   3. Hyperlipidemia, unspecified hyperlipidemia type      PLAN:  In order of problems listed above:  Recurrent jaw pain and leg weakness similar to when he had his MI occurs when riding uphill on his bike.  Discussed with Dr. Burt Knack who recommends reassessing with cardiac catheterization.  Will start low-dose  beta-blocker metoprolol 12.5 mg twice daily. I have reviewed the risks, indications, and alternatives to angioplasty and stenting with the patient. Risks include but are not limited to bleeding, infection, vascular injury, stroke, myocardial infection, arrhythmia, kidney injury, radiation-related injury in the case of prolonged fluoroscopy use, emergency cardiac surgery, and death. The patient understands the risks of serious complication is low (<2%) and patient agrees to proceed.     hx of coronary artery disease status post inferior STEMI in 2017, treated with a drug-eluting stent in the right coronary artery, normal NST 04/2018 now with similar anginal symptoms.  Hyperlipidemia LDL 98 09/2018 on Lipitor 80 mg once daily.  Add Zetia 10 mg once daily   Medication Adjustments/Labs and Tests Ordered: Current medicines are reviewed at length with the patient today.  Concerns regarding medicines are outlined above.  Medication changes, Labs and Tests ordered today are listed in the Patient Instructions below. Patient Instructions  Medication Instructions:  Your physician has rec ommended you make the following change in your medication:   START ZETIA 17m daily START Metoprolol Tartrate 24m1/2 tablet twice daily  *If you need a refill on your cardiac medications before your next appointment, please call your pharmacy*   Lab Work: TODAY: BMET, CBC  If you have labs (blood work) drawn today and your tests are completely normal, you will receive your results only by: . Marland KitchenyChart Message (if you have MyChart) OR . A paper copy in the mail If you have any lab test that is abnormal or we need to change your treatment, we will call you to review the results.   Testing/Procedures:    [place] [address] [id number]: [phone]: 33[place]8/25/2021  You are scheduled for a Cardiac Catheterization  on Friday, September 17 with Dr. [name].  1. Please arrive at the [facility] (Main Entrance A) at [facility]: [address] [place], [place] [address] at 5:30 AM (This time is two hours before your procedure to ensure your preparation). Free valet parking service is available.   Special note: Every effort is made to have your procedure done on time. Please understand that emergencies sometimes delay scheduled procedures.  2. Diet: Do not eat solid foods after midnight.  The patient may have clear liquids until 5am upon the day of the procedure.  3. Labs: You will need to have blood drawn today  4. Medication instructions in preparation for your procedure:   Contrast Allergy: No    Current Outpatient Medications (Cardiovascular):  .  atorvastatin (LIPITOR) 80 MG tablet, TAKE 1 TABLET(80 MG) BY MOUTH DAILY AT 6 PM .  nitroGLYCERIN (NITROSTAT) 0.4 MG SL tablet, DISSOLVE 1 TABLET UNDER THE TONGUE EVERY 5 MINUTES UP TO 3 DOSES AS NEEDED FOR CHEST PAIN .  ezetimibe (ZETIA) 10 MG tablet, Take 1 tablet (10 mg total) by mouth daily. .  metoprolol tartrate (LOPRESSOR) 25 MG tablet, Take 0.5 tablets (12.5 mg total) by mouth 2 (two) times daily.   Current Outpatient Medications (Analgesics):  .  aspirin 81 MG tablet, Take 81 mg by mouth daily.   Current Outpatient Medications (Other):  Marland Kitchen  Coenzyme Q10 100 MG capsule, Take 100 mg by mouth 2 (two) times daily. Marland Kitchen  ELDERBERRY PO, Take 1 tablespoon by mouth once daily .  GLUCOSAMINE-CHONDROITIN PO, Take 1,200 mg by mouth daily. .  Multiple Vitamin (MULTIVITAMIN) tablet, Takes 1 tablespoon  Reliv vitamin And herbs by mouth daily .  Omega-3 Fatty Acids (FISH OIL ULTRA) 1400 MG CAPS, Take 1,400 mg by mouth 2 (two) times daily.   On the morning of your procedure, take your Aspirin and any morning medicines NOT listed above.  You may use sips of water.  5. Plan  for one night stay--bring personal belongings. 6. Bring a current list of your medications and current insurance cards. 7. You MUST have a responsible person to drive you home. 8. Someone MUST be with you the first 24 hours after you arrive home or your discharge will be delayed. 9. Please wear clothes that are easy to get on and off and wear slip-on shoes.  Thank you for allowing [place] to care for you!   -- [place] [facility] services    Follow-Up: At [facility], you and your health needs are our priority.  As part of our continuing mission to provide you with exceptional heart care, we have created designated Provider Care Teams.  These Care Teams include your primary Cardiologist (physician) and Advanced Practice Providers (APPs -  Physician Assistants and Nurse Practitioners) who all work together to provide you with the care you need, when you need it.  We recommend signing up for the patient portal called "MyChart".  Sign up information is provided on this After Visit Summary.  MyChart is used to connect with patients for Virtual Visits (Telemedicine).  Patients are able to view lab/test results, encounter notes, upcoming appointments, etc.  Non-urgent messages can be sent to your provider as well.   To learn more about what you can do with MyChart, go to [email or web address].    Your next appointment:   01/18/2020  The format for your next appointment:   In Person  Provider:   Ermalinda Barrios, PA-C   Other Instructions  COVID SCREENING INFORMATION: You are scheduled for your drive-thru COVID screening on 12/27/2019 at 8:35. Pre-Procedural COVID-19 Testing Site [phone] [place], [place] [address] You will need to go home after your screening and quarantine until your procedure.       Signed, Ermalinda Barrios, PA-C  12/07/2019 2:57 PM    [facility] [place], [place], [place]  [address] Phone: [phone]; Fax: [phone]

## 2019-12-07 ENCOUNTER — Ambulatory Visit: Payer: PPO | Admitting: Physician Assistant

## 2019-12-07 ENCOUNTER — Encounter: Payer: Self-pay | Admitting: Physician Assistant

## 2019-12-07 ENCOUNTER — Other Ambulatory Visit: Payer: Self-pay

## 2019-12-07 VITALS — BP 140/72 | HR 70 | Ht 67.0 in | Wt 221.0 lb

## 2019-12-07 DIAGNOSIS — R6884 Jaw pain: Secondary | ICD-10-CM

## 2019-12-07 DIAGNOSIS — E785 Hyperlipidemia, unspecified: Secondary | ICD-10-CM | POA: Diagnosis not present

## 2019-12-07 DIAGNOSIS — I25119 Atherosclerotic heart disease of native coronary artery with unspecified angina pectoris: Secondary | ICD-10-CM | POA: Diagnosis not present

## 2019-12-07 MED ORDER — EZETIMIBE 10 MG PO TABS: 10.0000 mg | ORAL_TABLET | Freq: Every day | ORAL | 3 refills | Status: DC

## 2019-12-07 MED ORDER — METOPROLOL TARTRATE 25 MG PO TABS: 12.5000 mg | ORAL_TABLET | Freq: Two times a day (BID) | ORAL | 3 refills | Status: DC

## 2019-12-07 NOTE — Patient Instructions (Signed)
Medication Instructions:  Your physician has rec ommended you make the following change in your medication:   START ZETIA 10mg  daily START Metoprolol Tartrate 25mg  1/2 tablet twice daily  *If you need a refill on your cardiac medications before your next appointment, please call your pharmacy*   Lab Work: TODAY: BMET, CBC  If you have labs (blood work) drawn today and your tests are completely normal, you will receive your results only by: Marland Kitchen MyChart Message (if you have MyChart) OR . A paper copy in the mail If you have any lab test that is abnormal or we need to change your treatment, we will call you to review the results.   Testing/Procedures:    [place] OFFICE [place], [place] [address] Dept: [phone] Loc: [phone]  Gayland Nicol  12/07/2019  You are scheduled for a Cardiac Catheterization on Friday, September 17 with Dr. [name].  1. Please arrive at the [facility] (Main Entrance A) at [facility]: [address] [place], [place] [address] at 5:30 AM (This time is two hours before your procedure to ensure your preparation). Free valet parking service is available.   Special note: Every effort is made to have your procedure done on time. Please understand that emergencies sometimes delay scheduled procedures.  2. Diet: Do not eat solid foods after midnight.  The patient may have clear liquids until 5am upon the day of the procedure.  3. Labs: You will need to have blood drawn today  4. Medication instructions in preparation for your procedure:   Contrast Allergy: No    Current Outpatient Medications (Cardiovascular):  .  atorvastatin (LIPITOR) 80 MG tablet, TAKE 1 TABLET(80 MG) BY MOUTH DAILY AT 6 PM .  nitroGLYCERIN (NITROSTAT) 0.4 MG SL tablet, DISSOLVE 1 TABLET UNDER THE TONGUE EVERY 5 MINUTES UP TO 3 DOSES AS NEEDED FOR CHEST PAIN .   ezetimibe (ZETIA) 10 MG tablet, Take 1 tablet (10 mg total) by mouth daily. .  metoprolol tartrate (LOPRESSOR) 25 MG tablet, Take 0.5 tablets (12.5 mg total) by mouth 2 (two) times daily.   Current Outpatient Medications (Analgesics):  .  aspirin 81 MG tablet, Take 81 mg by mouth daily.   Current Outpatient Medications (Other):  Marland Kitchen  Coenzyme Q10 100 MG capsule, Take 100 mg by mouth 2 (two) times daily. Marland Kitchen  ELDERBERRY PO, Take 1 tablespoon by mouth once daily .  GLUCOSAMINE-CHONDROITIN PO, Take 1,200 mg by mouth daily. .  Multiple Vitamin (MULTIVITAMIN) tablet, Takes 1 tablespoon  Reliv vitamin And herbs by mouth daily .  Omega-3 Fatty Acids (FISH OIL ULTRA) 1400 MG CAPS, Take 1,400 mg by mouth 2 (two) times daily.   On the morning of your procedure, take your Aspirin and any morning medicines NOT listed above.  You may use sips of water.  5. Plan for one night stay--bring personal belongings. 6. Bring a current list of your medications and current insurance cards. 7. You MUST have a responsible person to drive you home. 8. Someone MUST be with you the first 24 hours after you arrive home or your discharge will be delayed. 9. Please wear clothes that are easy to get on and off and wear slip-on shoes.  Thank you for allowing [place] to care for you!   -- [place] [facility] Cardi[facility] services    Follow-Up: At [facility], you and your health needs are our priority.  As  part of our continuing mission to provide you with exceptional heart care, we have created designated Provider Care Teams.  These Care Teams include your primary Cardiologist (physician) and Advanced Practice Providers (APPs -  Physician Assistants and Nurse Practitioners) who all work together to provide you with the care you need, when you need it.  We recommend signing up for the patient portal called "MyChart".  Sign up information is provided on this After Visit Summary.  MyChart is used to connect with patients  for Virtual Visits (Telemedicine).  Patients are able to view lab/test results, encounter notes, upcoming appointments, etc.  Non-urgent messages can be sent to your provider as well.   To learn more about what you can do with MyChart, go to [email or web address].    Your next appointment:   01/18/2020  The format for your next appointment:   In Person  Provider:   Ermalinda Barrios, PA-C   Other Instructions COVID SCREENING INFORMATION: You are scheduled for your drive-thru COVID screening on 12/27/2019 at 8:35. Pre-Procedural COVID-19 Testing Site [phone] [place], [place] [address] You will need to go home after your screening and quarantine until your procedure.

## 2019-12-08 LAB — BASIC METABOLIC PANEL
BUN/Creatinine Ratio: 20 (ref 10–24)
BUN: 17 mg/dL (ref 8–27)
CO2: 24 mmol/L (ref 20–29)
Calcium: 9.1 mg/dL (ref 8.6–10.2)
Chloride: 99 mmol/L (ref 96–106)
Creatinine, Ser: 0.87 mg/dL (ref 0.76–1.27)
GFR calc Af Amer: 95 mL/min/{1.73_m2} (ref >59)
GFR calc non Af Amer: 82 mL/min/{1.73_m2} (ref >59)
Glucose: 97 mg/dL (ref 65–99)
Potassium: 4.5 mmol/L (ref 3.5–5.2)
Sodium: 138 mmol/L (ref 134–144)

## 2019-12-08 LAB — CBC
Hematocrit: 43.7 % (ref 37.5–51.0)
Hemoglobin: 15.3 g/dL (ref 13.0–17.7)
MCH: 30.9 pg (ref 26.6–33.0)
MCHC: 35 g/dL (ref 31.5–35.7)
MCV: 88 fL (ref 79–97)
Platelets: 174 10*3/uL (ref 150–450)
RBC: 4.95 x10E6/uL (ref 4.14–5.80)
RDW: 13.1 % (ref 11.6–15.4)
WBC: 7.3 10*3/uL (ref 3.4–10.8)

## 2019-12-23 ENCOUNTER — Ambulatory Visit: Payer: PPO | Admitting: Physician Assistant

## 2019-12-26 ENCOUNTER — Telehealth: Payer: Self-pay | Admitting: Cardiovascular Disease

## 2019-12-26 NOTE — Telephone Encounter (Signed)
Left message to call back  

## 2019-12-26 NOTE — Telephone Encounter (Signed)
Pt is returning call.  

## 2019-12-26 NOTE — Telephone Encounter (Signed)
Reviewed cath medication instructions with Lance Hancock. He understands to have no food after midnight and clear liquids until 5AM.  He understands to make sure to take his ASA the AM of his catheterization (and that it is OK to take his other morning medications late since he usually takes them around 0830). He was grateful for assistance.

## 2019-12-26 NOTE — Telephone Encounter (Signed)
Pt called and wanted to know what the "do's" and "don't" in regards to the medication that the patient is taking.

## 2019-12-27 ENCOUNTER — Other Ambulatory Visit (HOSPITAL_COMMUNITY)
Admission: RE | Admit: 2019-12-27 | Discharge: 2019-12-27 | Disposition: A | Discharge status: Home / Self Care | Payer: PPO | Source: Ambulatory Visit | Attending: Cardiovascular Disease | Admitting: Cardiovascular Disease

## 2019-12-27 DIAGNOSIS — Z20822 Contact with and (suspected) exposure to covid-19: Secondary | ICD-10-CM | POA: Diagnosis not present

## 2019-12-27 DIAGNOSIS — Z01812 Encounter for preprocedural laboratory examination: Secondary | ICD-10-CM | POA: Diagnosis not present

## 2019-12-27 LAB — SARS CORONAVIRUS 2 (TAT 6-24 HRS): SARS Coronavirus 2: NEGATIVE

## 2019-12-29 ENCOUNTER — Telehealth: Payer: Self-pay | Admitting: *Deleted

## 2019-12-29 NOTE — Telephone Encounter (Addendum)
Pt contacted pre-catheterization scheduled at [facility] for: Friday December 30, 2019 7:30 AM Verified arrival time and place: [facility]) at: 5:30 AM   No solid food after midnight prior to cath, clear liquids until 5 AM day of procedure.   AM meds can be  taken pre-cath with sips of water including: ASA 81 mg   Confirmed patient has responsible adult to drive home post procedure and be with patient first 24 hours after arriving home: yes  You are allowed ONE visitor in the waiting room during the time you are at the hospital for your procedure. Both you and your visitor must wear a mask once you enter the hospital.       COVID-19 Pre-Screening Questions:  . In the past 10 days have you had a new cough, shortness of breath, headache, congestion, fever (100 or greater) unexplained body aches, new sore throat, or sudden loss of taste or sense of smell? no . In the past 10 days have you been around anyone with known Covid 19? no . Have you been vaccinated for COVID-19? Yes, see immunization history   Reviewed procedure/mask/visitor instructions, COVID-19 screening questions with patient.

## 2019-12-30 ENCOUNTER — Encounter (HOSPITAL_COMMUNITY)
Admission: RE | Disposition: A | Discharge status: Home / Self Care | Payer: Self-pay | Source: Ambulatory Visit | Attending: Cardiovascular Disease

## 2019-12-30 ENCOUNTER — Encounter (HOSPITAL_COMMUNITY): Payer: Self-pay | Admitting: Cardiovascular Disease

## 2019-12-30 ENCOUNTER — Ambulatory Visit (HOSPITAL_COMMUNITY)
Admission: RE | Admit: 2019-12-30 | Discharge: 2019-12-30 | Disposition: A | Discharge status: Home / Self Care | Payer: PPO | Source: Ambulatory Visit | Attending: Cardiovascular Disease | Admitting: Cardiovascular Disease

## 2019-12-30 DIAGNOSIS — I252 Old myocardial infarction: Secondary | ICD-10-CM | POA: Insufficient documentation

## 2019-12-30 DIAGNOSIS — I209 Angina pectoris, unspecified: Secondary | ICD-10-CM | POA: Diagnosis present

## 2019-12-30 DIAGNOSIS — I25119 Atherosclerotic heart disease of native coronary artery with unspecified angina pectoris: Secondary | ICD-10-CM | POA: Diagnosis not present

## 2019-12-30 DIAGNOSIS — Z7982 Long term (current) use of aspirin: Secondary | ICD-10-CM | POA: Diagnosis not present

## 2019-12-30 DIAGNOSIS — Z87891 Personal history of nicotine dependence: Secondary | ICD-10-CM | POA: Insufficient documentation

## 2019-12-30 DIAGNOSIS — Z79899 Other long term (current) drug therapy: Secondary | ICD-10-CM | POA: Insufficient documentation

## 2019-12-30 DIAGNOSIS — R6884 Jaw pain: Secondary | ICD-10-CM | POA: Insufficient documentation

## 2019-12-30 DIAGNOSIS — E785 Hyperlipidemia, unspecified: Secondary | ICD-10-CM | POA: Diagnosis not present

## 2019-12-30 DIAGNOSIS — Z955 Presence of coronary angioplasty implant and graft: Secondary | ICD-10-CM | POA: Diagnosis not present

## 2019-12-30 HISTORY — PX: INTRAVASCULAR PRESSURE WIRE/FFR STUDY: CATH118243

## 2019-12-30 HISTORY — PX: LEFT HEART CATH AND CORONARY ANGIOGRAPHY: CATH118249

## 2019-12-30 LAB — POCT ACTIVATED CLOTTING TIME: Activated Clotting Time: 246 seconds

## 2019-12-30 SURGERY — LEFT HEART CATH AND CORONARY ANGIOGRAPHY
Anesthesia: LOCAL

## 2019-12-30 MED ORDER — IOHEXOL 350 MG/ML SOLN
INTRAVENOUS | Status: DC | PRN
  Administered 2019-12-30: 45 mL

## 2019-12-30 MED ORDER — HEPARIN (PORCINE) IN NACL 1000-0.9 UT/500ML-% IV SOLN
INTRAVENOUS | Status: AC
  Filled 2019-12-30: qty 1000

## 2019-12-30 MED ORDER — ONDANSETRON HCL 4 MG/2ML IJ SOLN: 4.0000 mg | Freq: Four times a day (QID) | INTRAMUSCULAR | Status: DC | PRN

## 2019-12-30 MED ORDER — ACETAMINOPHEN 325 MG PO TABS: 650.0000 mg | ORAL_TABLET | ORAL | Status: DC | PRN

## 2019-12-30 MED ORDER — FENTANYL CITRATE (PF) 100 MCG/2ML IJ SOLN
INTRAMUSCULAR | Status: AC
  Filled 2019-12-30: qty 2

## 2019-12-30 MED ORDER — SODIUM CHLORIDE 0.9% FLUSH: 3.0000 mL | INTRAVENOUS | Status: DC | PRN

## 2019-12-30 MED ORDER — MIDAZOLAM HCL 2 MG/2ML IJ SOLN
INTRAMUSCULAR | Status: DC | PRN
  Administered 2019-12-30: 2 mg via INTRAVENOUS

## 2019-12-30 MED ORDER — MIDAZOLAM HCL 2 MG/2ML IJ SOLN
INTRAMUSCULAR | Status: AC
  Filled 2019-12-30: qty 2

## 2019-12-30 MED ORDER — SODIUM CHLORIDE 0.9% FLUSH: 3.0000 mL | Freq: Two times a day (BID) | INTRAVENOUS | Status: DC

## 2019-12-30 MED ORDER — HEPARIN (PORCINE) IN NACL 1000-0.9 UT/500ML-% IV SOLN
INTRAVENOUS | Status: DC | PRN
  Administered 2019-12-30 (×2): 500 mL

## 2019-12-30 MED ORDER — FENTANYL CITRATE (PF) 100 MCG/2ML IJ SOLN
INTRAMUSCULAR | Status: DC | PRN
Start: 2019-12-30 — End: 2019-12-30
  Administered 2019-12-30: 25 ug via INTRAVENOUS

## 2019-12-30 MED ORDER — ADENOSINE (DIAGNOSTIC) 140MCG/KG/MIN
INTRAVENOUS | Status: DC | PRN
  Administered 2019-12-30: 140 ug/kg/min via INTRAVENOUS

## 2019-12-30 MED ORDER — HEPARIN SODIUM (PORCINE) 1000 UNIT/ML IJ SOLN
INTRAMUSCULAR | Status: DC | PRN
  Administered 2019-12-30: 5000 [IU] via INTRAVENOUS
  Administered 2019-12-30: 4000 [IU] via INTRAVENOUS

## 2019-12-30 MED ORDER — HYDRALAZINE HCL 20 MG/ML IJ SOLN: 10.0000 mg | INTRAMUSCULAR | Status: DC | PRN

## 2019-12-30 MED ORDER — VERAPAMIL HCL 2.5 MG/ML IV SOLN
INTRAVENOUS | Status: DC | PRN
  Administered 2019-12-30: 10 mL via INTRA_ARTERIAL

## 2019-12-30 MED ORDER — ASPIRIN 81 MG PO CHEW: 81.0000 mg | CHEWABLE_TABLET | ORAL | Status: DC

## 2019-12-30 MED ORDER — LABETALOL HCL 5 MG/ML IV SOLN: 10.0000 mg | INTRAVENOUS | Status: DC | PRN

## 2019-12-30 MED ORDER — LIDOCAINE HCL (PF) 1 % IJ SOLN
INTRAMUSCULAR | Status: AC
  Filled 2019-12-30: qty 30

## 2019-12-30 MED ORDER — LIDOCAINE HCL (PF) 1 % IJ SOLN
INTRAMUSCULAR | Status: DC | PRN
  Administered 2019-12-30: 2 mL

## 2019-12-30 MED ORDER — SODIUM CHLORIDE 0.9 % WEIGHT BASED INFUSION: 1.0000 mL/kg/h | INTRAVENOUS | Status: DC

## 2019-12-30 MED ORDER — SODIUM CHLORIDE 0.9 % IV SOLN: 250.0000 mL | INTRAVENOUS | Status: DC | PRN

## 2019-12-30 MED ORDER — HEPARIN SODIUM (PORCINE) 1000 UNIT/ML IJ SOLN
INTRAMUSCULAR | Status: AC
  Filled 2019-12-30: qty 1

## 2019-12-30 MED ORDER — ADENOSINE 12 MG/4ML IV SOLN
INTRAVENOUS | Status: AC
  Filled 2019-12-30: qty 4

## 2019-12-30 MED ORDER — SODIUM CHLORIDE 0.9 % WEIGHT BASED INFUSION
3.0000 mL/kg/h | INTRAVENOUS | Status: AC
  Administered 2019-12-30: 3 mL/kg/h via INTRAVENOUS

## 2019-12-30 MED ORDER — VERAPAMIL HCL 2.5 MG/ML IV SOLN
INTRAVENOUS | Status: AC
  Filled 2019-12-30: qty 2

## 2019-12-30 SURGICAL SUPPLY — 13 items
CATH 5FR JL3.5 JR4 ANG PIG MP (CATHETERS) ×2 IMPLANT
CATH LAUNCHER 5F JR4 (CATHETERS) ×2 IMPLANT
DEVICE RAD COMP TR BAND LRG (VASCULAR PRODUCTS) ×2 IMPLANT
GLIDESHEATH SLEND SS 6F .021 (SHEATH) ×2 IMPLANT
GUIDEWIRE INQWIRE 1.5J.035X260 (WIRE) ×1 IMPLANT
GUIDEWIRE PRESSURE COMET II (WIRE) ×2 IMPLANT
INQWIRE 1.5J .035X260CM (WIRE) ×2
KIT ESSENTIALS PG (KITS) ×2 IMPLANT
KIT HEART LEFT (KITS) ×2 IMPLANT
PACK CARDIAC CATHETERIZATION (CUSTOM PROCEDURE TRAY) ×2 IMPLANT
TRANSDUCER W/STOPCOCK (MISCELLANEOUS) ×2 IMPLANT
TUBING CIL FLEX 10 FLL-RA (TUBING) ×2 IMPLANT
WIRE HI TORQ VERSACORE-J 145CM (WIRE) ×2 IMPLANT

## 2019-12-30 NOTE — Interval H&P Note (Signed)
Cath Lab Visit (complete for each Cath Lab visit)  Clinical Evaluation Leading to the Procedure:   ACS: No.  Non-ACS:    Anginal Classification: CCS III  Anti-ischemic medical therapy: No Therapy  Non-Invasive Test Results: No non-invasive testing performed  Prior CABG: No previous CABG      History and Physical Interval Note:  12/30/2019 7:35 AM  Lance Hancock  has presented today for surgery, with the diagnosis of chest pain.  The various methods of treatment have been discussed with the patient and family. After consideration of risks, benefits and other options for treatment, the patient has consented to  Procedure(s): LEFT HEART CATH AND CORONARY ANGIOGRAPHY (N/A) as a surgical intervention.  The patient's history has been reviewed, patient examined, no change in status, stable for surgery.  I have reviewed the patient's chart and labs.  Questions were answered to the patient's satisfaction.     [name]

## 2019-12-30 NOTE — Discharge Instructions (Signed)
Radial Site Care  This sheet gives you information about how to care for yourself after your procedure. Your health care provider may also give you more specific instructions. If you have problems or questions, contact your health care provider. What can I expect after the procedure? After the procedure, it is common to have:  Bruising and tenderness at the catheter insertion area. Follow these instructions at home: Medicines  Take over-the-counter and prescription medicines only as told by your health care provider. Insertion site care  Follow instructions from your health care provider about how to take care of your insertion site. Make sure you: ? Wash your hands with soap and water before you change your bandage (dressing). If soap and water are not available, use hand sanitizer. ? Change your dressing as told by your health care provider. ? Leave stitches (sutures), skin glue, or adhesive strips in place. These skin closures may need to stay in place for 2 weeks or longer. If adhesive strip edges start to loosen and curl up, you may trim the loose edges. Do not remove adhesive strips completely unless your health care provider tells you to do that.  Check your insertion site every day for signs of infection. Check for: ? Redness, swelling, or pain. ? Fluid or blood. ? Pus or a bad smell. ? Warmth.  Do not take baths, swim, or use a hot tub until your health care provider approves.  You may shower 24-48 hours after the procedure, or as directed by your health care provider. ? Remove the dressing and gently wash the site with plain soap and water. ? Pat the area dry with a clean towel. ? Do not rub the site. That could cause bleeding.  Do not apply powder or lotion to the site. Activity   For 24 hours after the procedure, or as directed by your health care provider: ? Do not flex or bend the affected arm. ? Do not push or pull heavy objects with the affected arm. ? Do not  drive yourself home from the hospital or clinic. You may drive 24 hours after the procedure unless your health care provider tells you not to. ? Do not operate machinery or power tools.  Do not lift anything that is heavier than 10 lb (4.5 kg), or the limit that you are told, until your health care provider says that it is safe.  Ask your health care provider when it is okay to: ? Return to work or school. ? Resume usual physical activities or sports. ? Resume sexual activity. General instructions  If the catheter site starts to bleed, raise your arm and put firm pressure on the site. If the bleeding does not stop, get help right away. This is a medical emergency.  If you went home on the same day as your procedure, a responsible adult should be with you for the first 24 hours after you arrive home.  Keep all follow-up visits as told by your health care provider. This is important. Contact a health care provider if:  You have a fever.  You have redness, swelling, or yellow drainage around your insertion site. Get help right away if:  You have unusual pain at the radial site.  The catheter insertion area swells very fast.  The insertion area is bleeding, and the bleeding does not stop when you hold steady pressure on the area.  Your arm or hand becomes pale, cool, tingly, or numb. These symptoms may represent a serious problem   that is an emergency. Do not wait to see if the symptoms will go away. Get medical help right away. Call your local emergency services (911 in the [place] Do not drive yourself to the hospital. Summary  After the procedure, it is common to have bruising and tenderness at the site.  Follow instructions from your health care provider about how to take care of your radial site wound. Check the wound every day for signs of infection.  Do not lift anything that is heavier than 10 lb (4.5 kg), or the limit that you are told, until your health care provider says  that it is safe. This information is not intended to replace advice given to you by your health care provider. Make sure you discuss any questions you have with your health care provider. Document Revised: 05/06/2017 Document Reviewed: 05/06/2017 Elsevier Patient Education  2020 [employer]  

## 2019-12-30 NOTE — Progress Notes (Signed)
No order for cardiac monitoring seen in post op orders. Spoke with Dr. Burt Knack and he stated patient does not need cardiac monitoring while in Short Stay.

## 2020-01-17 NOTE — Progress Notes (Signed)
Cardiology Office Note    Date:  01/18/2020   ID:  Lance Hancock, DOB 09/22/1940, MRN [id number]  PCP:  Larene Beach, MD  Cardiologist: [name], MD EPS: None  Chief Complaint  Patient presents with  . Hospitalization Follow-up    History of Present Illness:  Lance Hancock is a 79 y.o. male  with a hx of coronary artery disease status post inferior STEMI in 2017, treated with a drug-eluting stent in the right coronary artery, normal NST 04/2018.   I saw the patient 12/07/2019 complaining of jaw pain and fatigue similar to his symptoms prior to his heart attack.  Cardiac cath 12/30/2019 showed moderate 50% restenosis in the proximal RCA but negative FFR, patent left main LAD and left circumflex with minor nonobstructive disease.  Normal LVEDP medical therapy recommended.Lipitor was stopped to see if it helped his symptoms.  Patient comes in for f/u. No further jaw pain. Started riding his bike again- 6 miles 3 times/week. Still has upper leg fatigue but improving. Has been off lipitor for 2 weeks.    Past Medical History:  Diagnosis Date  . CAD (coronary artery disease)    a. STEMI 08/2015 >> LHC:  oLM 30, mLAD 30, pLCx 25, pRCA 99, EF 55-60 inf HK >> PCI:  3.5x11m Promus DES to prox RCA, 3.0x258mPromus DES to prox RCA.  // Myoview 04/2018:  Normal perfusion, EF 63; Low Risk   . History of acute inferior wall MI 08/15/2015  . History of echocardiogram    a. Echo 5/17: EF 55-60%, normal wall motion, normal diastolic function, MAC, moderate LAE  . History of nuclear stress test    Myoview 5/18: Medium size, moderate intensity fixed septal defect - likely LBBB-related. No reversible ischemia. LVEF 53% with normal wall motion. This is a low risk study.  . Marland KitchenLD (hyperlipidemia)     Past Surgical History:  Procedure Laterality Date  . BREAST SURGERY     age 79 . Marland KitchenARDIAC CATHETERIZATION N/A 08/15/2015   Procedure: Left Heart Cath and Coronary Angiography;  Surgeon: MiSherren MochaMD;  [facility];  Service: Cardiovascular;  Laterality: N/A;  . CARDIAC CATHETERIZATION N/A 08/15/2015   Procedure: Coronary Stent Intervention;  Surgeon: MiSherren MochaMD;  [facility];  Service: Cardiovascular;  Laterality: N/A;  Proximal RCA  (3.5/16 and 3.0/20 Promus)  . EYE SURGERY    . INTRAVASCULAR PRESSURE WIRE/FFR STUDY N/A 12/30/2019   Procedure: INTRAVASCULAR PRESSURE WIRE/FFR STUDY;  Surgeon: CoSherren MochaMD;  [facility];  Service: Cardiovascular;  Laterality: N/A;  . LEFT HEART CATH AND CORONARY ANGIOGRAPHY N/A 12/30/2019   Procedure: LEFT HEART CATH AND CORONARY ANGIOGRAPHY;  Surgeon: CoSherren MochaMD;  [facility];  Service: Cardiovascular;  Laterality: N/A;    Current Medications: Current Meds  Medication Sig  . aspirin 81 MG tablet Take 81 mg by mouth daily.  . Marland Kitchentorvastatin (LIPITOR) 80 MG tablet TAKE 1 TABLET(80 MG) BY MOUTH DAILY AT 6 PM (Patient taking differently: Take 80 mg by mouth daily at 6 PM. )  . Coenzyme Q10 200 MG capsule Take 200 mg by mouth daily.   . Marland KitchenLDERBERRY PO Take 15 mLs by mouth daily.   . Marland Kitchenzetimibe (ZETIA) 10 MG tablet Take 1 tablet (10 mg total) by mouth daily.  . Marland KitchenLUCOSAMINE-CHONDROITIN PO Take 1 tablet by mouth daily.   . metoprolol tartrate (LOPRESSOR) 25 MG tablet Take 0.5 tablets (12.5 mg total)  by mouth 2 (two) times daily.  . Multiple Vitamin (MULTIVITAMIN) tablet Takes 1 tablespoon  Reliv vitamin And herbs by mouth daily  . nitroGLYCERIN (NITROSTAT) 0.4 MG SL tablet DISSOLVE 1 TABLET UNDER THE TONGUE EVERY 5 MINUTES UP TO 3 DOSES AS NEEDED FOR CHEST PAIN (Patient taking differently: Place 0.4 mg under the tongue every 5 (five) minutes as needed for chest pain. )  . Omega-3 Fatty Acids (FISH OIL ULTRA) 1400 MG CAPS Take 1,400 mg by mouth 2 (two) times daily.     Allergies:   Tetracyclines & related   Social History   Socioeconomic History  . Marital status:  Married    Spouse name: Not on file  . Number of children: Not on file  . Years of education: Not on file  . Highest education level: Not on file  Occupational History  . Not on file  Tobacco Use  . Smoking status: Former Smoker    Packs/day: 2.00    Years: 18.00    Pack years: 36.00    Types: Cigarettes    Quit date: 01/19/1976    Years since quitting: 44.0  . Smokeless tobacco: Former Network engineer  . Vaping Use: Never used  Substance and Sexual Activity  . Alcohol use: Yes    Comment: occasional  . Drug use: No  . Sexual activity: Not on file  Other Topics Concern  . Not on file  Social History Narrative   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   Social Determinants of Health   Financial Resource Strain:   . Difficulty of Paying Living Expenses: Not on file  Food Insecurity:   . Worried About Charity fundraiser in the Last Year: Not on file  . Ran Out of Food in the Last Year: Not on file  Transportation Needs:   . Lack of Transportation (Medical): Not on file  . Lack of Transportation (Non-Medical): Not on file  Physical Activity:   . Days of Exercise per Week: Not on file  . Minutes of Exercise per Session: Not on file  Stress:   . Feeling of Stress : Not on file  Social Connections:   . Frequency of Communication with Friends and Family: Not on file  . Frequency of Social Gatherings with Friends and Family: Not on file  . Attends Religious Services: Not on file  . Active Member of Clubs or Organizations: Not on file  . Attends Archivist Meetings: Not on file  . Marital Status: Not on file     Family History:  The patient's family history includes Heart disease in his sister; Hyperlipidemia in his mother; Hypertension in his mother.   ROS:   Please see the history of present illness.    ROS All other systems reviewed and are negative.   PHYSICAL EXAM:   VS:  BP 134/66   Pulse 72   Ht  _0  (1.702 m)   Wt 218 lb 9.6 oz (99.2 kg)   SpO2 96%   BMI 34.24 kg/m   Physical Exam  GEN: Well nourished, well developed, in no acute distress  Neck: no JVD, carotid bruits, or masses Cardiac:RRR; no murmurs, rubs, or gallops  Respiratory:  clear to auscultation bilaterally, normal work of breathing GI: soft, nontender, nondistended, + BS Ext: without cyanosis, clubbing, or edema, Good distal pulses bilaterally Neuro:  Alert and Oriented x 3 Psych: euthymic mood, full  affect  Wt Readings from Last 3 Encounters:  01/18/20 218 lb 9.6 oz (99.2 kg)  12/30/19 212 lb (96.2 kg)  12/07/19 221 lb (100.2 kg)      Studies/Labs Reviewed:   EKG:  EKG is not ordered today.   Recent Labs: 12/07/2019: BUN 17; Creatinine, Ser 0.87; Hemoglobin 15.3; Platelets 174; Potassium 4.5; Sodium 138   Lipid Panel    Component Value Date/Time   CHOL 138 08/15/2015 0351   TRIG 26 08/15/2015 0351   HDL 43 08/15/2015 0351   CHOLHDL 3.2 08/15/2015 0351   VLDL 5 08/15/2015 0351   LDLCALC 90 08/15/2015 0351    Additional studies/ records that were reviewed today include:  Cardiac cath 12/30/2019 Single-vessel coronary artery disease with moderate 50% restenosis in the proximal RCA, negative FFR evaluation on today's study 2.  Patent left main, LAD, and left circumflex with minor nonobstructive coronary artery disease 3.  Normal LVEDP   Recommend: Ongoing medical therapy.  NST 05/10/2018 Study Highlights      Nuclear stress EF: 63%.  The study is normal.  This is a low risk study.   Low risk stress nuclear study with normal perfusion and normal left ventricular regional and global systolic function. Interestingly, although LBBB is still present, the previously reported LBBB related artifact is no longer seen.     Cardiac catheterization 08/15/2015  Prox RCA lesion, 99% stenosed. Post intervention, there is a 0% residual stenosis.  Ost LM lesion, 30% stenosed.  There is mild left  ventricular systolic dysfunction.  Mid LAD to Dist LAD lesion, 30% stenosed.  Prox Cx to Mid Cx lesion, 25% stenosed.   1. Subtotal occlusion of the RCA with TIMI 2 flow, treated successfully with overlapping drug-eluting stents in the proximal vessel 2. Mild nonobstructive plaque in the left main and LAD 3. Mild segmental LV contraction abnormality with preserved overall LV function   Recommendations: Dual antiplatelet therapy with aspirin and Brilinta for 12 months, post MI medical therapy, if stable the patient is candidate for hospital discharge in 48 hours.        ASSESSMENT:    1. Coronary artery disease involving native coronary artery of native heart without angina pectoris   2. Hyperlipidemia, unspecified hyperlipidemia type   3. LBBB (left bundle branch block)      PLAN:  In order of problems listed above:  Coronary artery disease status post inferior STEMI in 2017, treated with a drug-eluting stent in the right coronary artery, normal NST 04/2018  recurrent jaw pain with exertion, repeat cardiac cath 12/30/2019 moderate 50% restenosis in the proximal RCA but negative FFR.  Otherwise minor nonobstructive CAD normal LVEDP medical therapy recommended. No further jaw pain riding bike again. Continue ASA, Zetia, metoprolol   Hyperlipidemia LDL 98 09/2018 on Lipitor 80 mg once daily.  Add Zetia 10 mg once daily-lipitor held for 2 weeks to see if leg fatigue would go away. It has improved but not sure it's associated. Try to restart lipitor and let us know if symptoms worsen. To have FLP in Dec   LBBB chronic    Medication Adjustments/Labs and Tests Ordered: Current medicines are reviewed at length with the patient today.  Concerns regarding medicines are outlined above.  Medication changes, Labs and Tests ordered today are listed in the Patient Instructions below. There are no Patient Instructions on file for this visit.   Signed, Ermalinda Barrios, PA-C  01/18/2020 11:31 AM     [facility] [address], [place], [place]  [address] Phone: [phone]; Fax: [phone]

## 2020-01-18 ENCOUNTER — Ambulatory Visit: Payer: PPO | Admitting: Physician Assistant

## 2020-01-18 ENCOUNTER — Other Ambulatory Visit: Payer: Self-pay

## 2020-01-18 ENCOUNTER — Encounter: Payer: Self-pay | Admitting: Physician Assistant

## 2020-01-18 VITALS — BP 134/66 | HR 72 | Ht 67.0 in | Wt 218.6 lb

## 2020-01-18 DIAGNOSIS — I447 Left bundle-branch block, unspecified: Secondary | ICD-10-CM | POA: Diagnosis not present

## 2020-01-18 DIAGNOSIS — I2510 Atherosclerotic heart disease of native coronary artery without angina pectoris: Secondary | ICD-10-CM

## 2020-01-18 DIAGNOSIS — E785 Hyperlipidemia, unspecified: Secondary | ICD-10-CM

## 2020-01-18 NOTE — Patient Instructions (Addendum)
Medication Instructions:  Your physician has recommended you make the following change in your medication:   RESTART: Atorvastatin 80mg  daily  *If you need a refill on your cardiac medications before your next appointment, please call your pharmacy*   Lab Work: None If you have labs (blood work) drawn today and your tests are completely normal, you will receive your results only by: Marland Kitchen MyChart Message (if you have MyChart) OR . A paper copy in the mail If you have any lab test that is abnormal or we need to change your treatment, we will call you to review the results.   Follow-Up: At [facility], you and your health needs are our priority.  As part of our continuing mission to provide you with exceptional heart care, we have created designated Provider Care Teams.  These Care Teams include your primary Cardiologist (physician) and Advanced Practice Providers (APPs -  Physician Assistants and Nurse Practitioners) who all work together to provide you with the care you need, when you need it.   Your next appointment:   1 year(s)  The format for your next appointment:   In Person  Provider:   [name], MD

## 2020-02-03 ENCOUNTER — Ambulatory Visit: Payer: PPO | Admitting: Cardiovascular Disease

## 2020-02-28 DIAGNOSIS — K635 Polyp of colon: Secondary | ICD-10-CM | POA: Diagnosis not present

## 2020-02-28 DIAGNOSIS — D122 Benign neoplasm of ascending colon: Secondary | ICD-10-CM | POA: Diagnosis not present

## 2020-02-28 DIAGNOSIS — Z86010 Personal history of colonic polyps: Secondary | ICD-10-CM | POA: Diagnosis not present

## 2020-03-14 DIAGNOSIS — K648 Other hemorrhoids: Secondary | ICD-10-CM | POA: Diagnosis not present

## 2020-03-14 DIAGNOSIS — R5382 Chronic fatigue, unspecified: Secondary | ICD-10-CM | POA: Diagnosis not present

## 2020-03-14 DIAGNOSIS — I25119 Atherosclerotic heart disease of native coronary artery with unspecified angina pectoris: Secondary | ICD-10-CM | POA: Diagnosis not present

## 2020-03-14 DIAGNOSIS — R972 Elevated prostate specific antigen [PSA]: Secondary | ICD-10-CM | POA: Diagnosis not present

## 2020-03-14 DIAGNOSIS — N529 Male erectile dysfunction, unspecified: Secondary | ICD-10-CM | POA: Diagnosis not present

## 2020-03-14 DIAGNOSIS — E782 Mixed hyperlipidemia: Secondary | ICD-10-CM | POA: Diagnosis not present

## 2020-03-14 DIAGNOSIS — E119 Type 2 diabetes mellitus without complications: Secondary | ICD-10-CM | POA: Diagnosis not present

## 2020-03-14 DIAGNOSIS — Z0000 Encounter for general adult medical examination without abnormal findings: Secondary | ICD-10-CM | POA: Diagnosis not present

## 2020-03-22 DIAGNOSIS — H43393 Other vitreous opacities, bilateral: Secondary | ICD-10-CM | POA: Diagnosis not present

## 2020-03-22 DIAGNOSIS — H2513 Age-related nuclear cataract, bilateral: Secondary | ICD-10-CM | POA: Diagnosis not present

## 2020-03-22 DIAGNOSIS — H52203 Unspecified astigmatism, bilateral: Secondary | ICD-10-CM | POA: Diagnosis not present

## 2020-03-22 DIAGNOSIS — H5213 Myopia, bilateral: Secondary | ICD-10-CM | POA: Diagnosis not present

## 2020-03-22 DIAGNOSIS — H524 Presbyopia: Secondary | ICD-10-CM | POA: Diagnosis not present

## 2020-03-22 DIAGNOSIS — E119 Type 2 diabetes mellitus without complications: Secondary | ICD-10-CM | POA: Diagnosis not present

## 2020-03-22 DIAGNOSIS — H25013 Cortical age-related cataract, bilateral: Secondary | ICD-10-CM | POA: Diagnosis not present

## 2020-04-18 DIAGNOSIS — R0789 Other chest pain: Secondary | ICD-10-CM | POA: Diagnosis not present

## 2020-04-18 DIAGNOSIS — R1013 Epigastric pain: Secondary | ICD-10-CM | POA: Diagnosis not present

## 2020-04-18 DIAGNOSIS — R195 Other fecal abnormalities: Secondary | ICD-10-CM | POA: Diagnosis not present

## 2020-05-16 ENCOUNTER — Telehealth: Payer: Self-pay | Admitting: Cardiovascular Disease

## 2020-05-16 NOTE — Telephone Encounter (Signed)
New message:    Patient calling to get a apt with Dr. Burt Knack he do not have anything. He would like to see cooper this time.

## 2020-05-24 ENCOUNTER — Other Ambulatory Visit: Payer: Self-pay | Admitting: Physician Assistant

## 2020-05-24 DIAGNOSIS — I25119 Atherosclerotic heart disease of native coronary artery with unspecified angina pectoris: Secondary | ICD-10-CM

## 2020-06-01 ENCOUNTER — Other Ambulatory Visit: Payer: Self-pay | Admitting: Physician Assistant

## 2020-06-01 DIAGNOSIS — I25119 Atherosclerotic heart disease of native coronary artery with unspecified angina pectoris: Secondary | ICD-10-CM

## 2020-06-08 NOTE — Telephone Encounter (Signed)
Confirmed with the patient he has no cardiac issues. Reiterated to the patient Dr. Antionette Char October schedule is not available yet and to call in a few months to arrange his 1 year follow-up. He was grateful for call and agrees with plan.

## 2020-07-19 DIAGNOSIS — M25551 Pain in right hip: Secondary | ICD-10-CM | POA: Diagnosis not present

## 2020-08-02 DIAGNOSIS — D485 Neoplasm of uncertain behavior of skin: Secondary | ICD-10-CM | POA: Diagnosis not present

## 2020-08-02 DIAGNOSIS — L820 Inflamed seborrheic keratosis: Secondary | ICD-10-CM | POA: Diagnosis not present

## 2020-08-13 DIAGNOSIS — M25651 Stiffness of right hip, not elsewhere classified: Secondary | ICD-10-CM | POA: Diagnosis not present

## 2020-08-13 DIAGNOSIS — R6889 Other general symptoms and signs: Secondary | ICD-10-CM | POA: Diagnosis not present

## 2020-08-13 DIAGNOSIS — M25551 Pain in right hip: Secondary | ICD-10-CM | POA: Diagnosis not present

## 2020-08-13 DIAGNOSIS — R29898 Other symptoms and signs involving the musculoskeletal system: Secondary | ICD-10-CM | POA: Diagnosis not present

## 2020-08-20 DIAGNOSIS — R6889 Other general symptoms and signs: Secondary | ICD-10-CM | POA: Diagnosis not present

## 2020-08-20 DIAGNOSIS — M25651 Stiffness of right hip, not elsewhere classified: Secondary | ICD-10-CM | POA: Diagnosis not present

## 2020-08-20 DIAGNOSIS — R29898 Other symptoms and signs involving the musculoskeletal system: Secondary | ICD-10-CM | POA: Diagnosis not present

## 2020-08-20 DIAGNOSIS — M25551 Pain in right hip: Secondary | ICD-10-CM | POA: Diagnosis not present

## 2020-08-29 DIAGNOSIS — M25651 Stiffness of right hip, not elsewhere classified: Secondary | ICD-10-CM | POA: Diagnosis not present

## 2020-08-29 DIAGNOSIS — R6889 Other general symptoms and signs: Secondary | ICD-10-CM | POA: Diagnosis not present

## 2020-08-29 DIAGNOSIS — M25551 Pain in right hip: Secondary | ICD-10-CM | POA: Diagnosis not present

## 2020-08-29 DIAGNOSIS — R29898 Other symptoms and signs involving the musculoskeletal system: Secondary | ICD-10-CM | POA: Diagnosis not present

## 2020-09-03 DIAGNOSIS — M25551 Pain in right hip: Secondary | ICD-10-CM | POA: Diagnosis not present

## 2020-09-03 DIAGNOSIS — M25651 Stiffness of right hip, not elsewhere classified: Secondary | ICD-10-CM | POA: Diagnosis not present

## 2020-09-03 DIAGNOSIS — R29898 Other symptoms and signs involving the musculoskeletal system: Secondary | ICD-10-CM | POA: Diagnosis not present

## 2020-09-03 DIAGNOSIS — R6889 Other general symptoms and signs: Secondary | ICD-10-CM | POA: Diagnosis not present

## 2020-09-12 DIAGNOSIS — R6889 Other general symptoms and signs: Secondary | ICD-10-CM | POA: Diagnosis not present

## 2020-09-12 DIAGNOSIS — R29898 Other symptoms and signs involving the musculoskeletal system: Secondary | ICD-10-CM | POA: Diagnosis not present

## 2020-09-12 DIAGNOSIS — M25551 Pain in right hip: Secondary | ICD-10-CM | POA: Diagnosis not present

## 2020-09-12 DIAGNOSIS — M25651 Stiffness of right hip, not elsewhere classified: Secondary | ICD-10-CM | POA: Diagnosis not present

## 2020-09-19 DIAGNOSIS — R29898 Other symptoms and signs involving the musculoskeletal system: Secondary | ICD-10-CM | POA: Diagnosis not present

## 2020-09-19 DIAGNOSIS — M25551 Pain in right hip: Secondary | ICD-10-CM | POA: Diagnosis not present

## 2020-09-19 DIAGNOSIS — M25651 Stiffness of right hip, not elsewhere classified: Secondary | ICD-10-CM | POA: Diagnosis not present

## 2020-09-19 DIAGNOSIS — R6889 Other general symptoms and signs: Secondary | ICD-10-CM | POA: Diagnosis not present

## 2020-09-26 DIAGNOSIS — M25651 Stiffness of right hip, not elsewhere classified: Secondary | ICD-10-CM | POA: Diagnosis not present

## 2020-09-26 DIAGNOSIS — M25551 Pain in right hip: Secondary | ICD-10-CM | POA: Diagnosis not present

## 2020-09-26 DIAGNOSIS — R29898 Other symptoms and signs involving the musculoskeletal system: Secondary | ICD-10-CM | POA: Diagnosis not present

## 2020-09-26 DIAGNOSIS — R6889 Other general symptoms and signs: Secondary | ICD-10-CM | POA: Diagnosis not present

## 2020-10-03 DIAGNOSIS — M25651 Stiffness of right hip, not elsewhere classified: Secondary | ICD-10-CM | POA: Diagnosis not present

## 2020-10-03 DIAGNOSIS — R29898 Other symptoms and signs involving the musculoskeletal system: Secondary | ICD-10-CM | POA: Diagnosis not present

## 2020-10-03 DIAGNOSIS — M25551 Pain in right hip: Secondary | ICD-10-CM | POA: Diagnosis not present

## 2020-10-03 DIAGNOSIS — R6889 Other general symptoms and signs: Secondary | ICD-10-CM | POA: Diagnosis not present

## 2020-11-14 ENCOUNTER — Other Ambulatory Visit: Payer: Self-pay | Admitting: Physician Assistant

## 2021-01-08 ENCOUNTER — Other Ambulatory Visit: Payer: Self-pay

## 2021-01-08 ENCOUNTER — Encounter: Payer: Self-pay | Admitting: Cardiovascular Disease

## 2021-01-08 ENCOUNTER — Ambulatory Visit: Payer: PPO | Admitting: Cardiovascular Disease

## 2021-01-08 VITALS — BP 132/76 | HR 61 | Ht 66.5 in | Wt 215.4 lb

## 2021-01-08 DIAGNOSIS — I2510 Atherosclerotic heart disease of native coronary artery without angina pectoris: Secondary | ICD-10-CM | POA: Diagnosis not present

## 2021-01-08 DIAGNOSIS — I447 Left bundle-branch block, unspecified: Secondary | ICD-10-CM

## 2021-01-08 DIAGNOSIS — E782 Mixed hyperlipidemia: Secondary | ICD-10-CM

## 2021-01-08 NOTE — Progress Notes (Signed)
Cardiology Office Note:    Date:  01/08/2021   ID:  Lance Hancock, DOB 04/03/1941, MRN [id number]  PCP:  Kathyrn Lass   [facility] Providers Cardiologist:  [name], MD     Referring MD: Larene Beach, MD   Chief Complaint  Patient presents with   Coronary Artery Disease     History of Present Illness:    Lance Hancock is a 80 y.o. male with a hx of coronary artery disease, presenting for follow-up evaluation.  The patient initially presented in 2017 with an inferior STEMI, treated with primary PCI using a drug-eluting stent in the right coronary artery.  The patient developed recurrent angina in 2021 and underwent repeat cardiac catheterization demonstrating moderate in-stent restenosis in the proximal RCA with negative FFR evaluation, patency of the left main, LAD, and left circumflex with only minor nonobstructive disease in his vessels.  LVEDP was normal and ongoing medical therapy was recommended.  The patient is here alone today. He is primarily limited by bilateral hip pain and weakness. He is able to ride a bike without limitation but his walking is limited. No thigh or calf pain. No chest pain or pressure. No shortness of breath. No palpitations. He has occasional jaw discomfort but states that it's nothing like what he felt at the time of his MI.   Past Medical History:  Diagnosis Date   CAD (coronary artery disease)    a. STEMI 08/2015 >> LHC:  oLM 30, mLAD 30, pLCx 25, pRCA 99, EF 55-60 inf HK >> PCI:  3.5x6m Promus DES to prox RCA, 3.0x255mPromus DES to prox RCA.  // Myoview 04/2018:  Normal perfusion, EF 63; Low Risk    History of acute inferior wall MI 08/15/2015   History of echocardiogram    a. Echo 5/17: EF 55-60%, normal wall motion, normal diastolic function, MAC, moderate LAE   History of nuclear stress test    Myoview 5/18: Medium size, moderate intensity fixed septal defect - likely LBBB-related. No reversible ischemia. LVEF 53% with normal wall motion.  This is a low risk study.   HLD (hyperlipidemia)     Past Surgical History:  Procedure Laterality Date   BREAST SURGERY     age 80  CARDIAC CATHETERIZATION N/A 08/15/2015   Procedure: Left Heart Cath and Coronary Angiography;  Surgeon: MiSherren MochaMD;  [facility];  Service: Cardiovascular;  Laterality: N/A;   CARDIAC CATHETERIZATION N/A 08/15/2015   Procedure: Coronary Stent Intervention;  Surgeon: MiSherren MochaMD;  [facility];  Service: Cardiovascular;  Laterality: N/A;  Proximal RCA  (3.5/16 and 3.0/20 Promus)   EYE SURGERY     INTRAVASCULAR PRESSURE WIRE/FFR STUDY N/A 12/30/2019   Procedure: INTRAVASCULAR PRESSURE WIRE/FFR STUDY;  Surgeon: CoSherren MochaMD;  [facility];  Service: Cardiovascular;  Laterality: N/A;   LEFT HEART CATH AND CORONARY ANGIOGRAPHY N/A 12/30/2019   Procedure: LEFT HEART CATH AND CORONARY ANGIOGRAPHY;  Surgeon: CoSherren MochaMD;  [facility];  Service: Cardiovascular;  Laterality: N/A;    Current Medications: Current Meds  Medication Sig   atorvastatin (LIPITOR) 80 MG tablet TAKE 1 TABLET(80 MG) BY MOUTH DAILY AT 6 PM   ELDERBERRY PO Take 15 mLs by mouth daily.    ezetimibe (ZETIA) 10 MG tablet TAKE 1 TABLET(10 MG) BY MOUTH DAILY   GLUCOSAMINE-CHONDROITIN PO Take 1 tablet by mouth daily.    Multiple Vitamin (MULTIVITAMIN) tablet Takes 1 tablespoon  Reliv vitamin And herbs by mouth daily   nitroGLYCERIN (NITROSTAT) 0.4 MG SL tablet DISSOLVE 1 TABLET UNDER THE TONGUE EVERY 5 MINUTES UP TO 3 DOSES AS NEEDED FOR CHEST PAIN   Omega-3 Fatty Acids (FISH OIL ULTRA) 1400 MG CAPS Take 1,400 mg by mouth 2 (two) times daily.   [DISCONTINUED] atorvastatin (LIPITOR) 80 MG tablet Take 1 tablet by mouth daily.     Allergies:   Tetracyclines & related   Social History   Socioeconomic History   Marital status: Married    Spouse name: Not on file   Number of children: Not on file   Years of  education: Not on file   Highest education level: Not on file  Occupational History   Not on file  Tobacco Use   Smoking status: Former    Packs/day: 2.00    Years: 18.00    Pack years: 36.00    Types: Cigarettes    Quit date: 01/19/1976    Years since quitting: 45.0   Smokeless tobacco: Former  Scientific laboratory technician Use: Never used  Substance and Sexual Activity   Alcohol use: Yes    Comment: occasional   Drug use: No   Sexual activity: Not on file  Other Topics Concern   Not on file  Social History Narrative   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   Social Determinants of Health   Financial Resource Strain: Not on file  Food Insecurity: Not on file  Transportation Needs: Not on file  Physical Activity: Not on file  Stress: Not on file  Social Connections: Not on file     Family History: The patient's family history includes Heart disease in his sister; Hyperlipidemia in his mother; Hypertension in his mother.  ROS:   Please see the history of present illness.    All other systems reviewed and are negative.  EKGs/Labs/Other Studies Reviewed:    The following studies were reviewed today: Myoview stress test 05/10/2018: Nuclear stress EF: 63%. The study is normal. This is a low risk study.   Low risk stress nuclear study with normal perfusion and normal left ventricular regional and global systolic function. Interestingly, although LBBB is still present, the previously reported LBBB related artifact is no longer seen.  Cardiac catheterization 12/30/2019: 1.  Single-vessel coronary artery disease with moderate 50% restenosis in the proximal RCA, negative FFR evaluation on today's study 2.  Patent left main, LAD, and left circumflex with minor nonobstructive coronary artery disease 3.  Normal LVEDP   Recommend: Ongoing medical therapy.  EKG:  EKG is ordered today.  The ekg ordered today demonstrates NSR with  LBBB, no significant change from previous  Recent Labs: No results found for requested labs within last 8760 hours.  Recent Lipid Panel    Component Value Date/Time   CHOL 138 08/15/2015 0351   TRIG 26 08/15/2015 0351   HDL 43 08/15/2015 0351   CHOLHDL 3.2 08/15/2015 0351   VLDL 5 08/15/2015 0351   LDLCALC 90 08/15/2015 0351     Risk Assessment/Calculations:           Physical Exam:    VS:  BP 132/76   Pulse 61   Ht 5' 6.5" (1.689 m)   Wt 215 lb 6.4 oz (97.7 kg)   SpO2 95%   BMI 34.25 kg/m     Wt Readings from Last 3 Encounters:  01/08/21 215 lb 6.4 oz (  97.7 kg)  01/18/20 218 lb 9.6 oz (99.2 kg)  12/30/19 212 lb (96.2 kg)     GEN:  Well nourished, well developed in no acute distress HEENT: Normal NECK: No JVD; No carotid bruits LYMPHATICS: No lymphadenopathy CARDIAC: RRR, no murmurs, rubs, gallops RESPIRATORY:  Clear to auscultation without rales, wheezing or rhonchi  ABDOMEN: Soft, non-tender, non-distended MUSCULOSKELETAL:  No edema; No deformity.  DP pulses are 2+ bilaterally. SKIN: Warm and dry NEUROLOGIC:  Alert and oriented x 3 PSYCHIATRIC:  Normal affect   ASSESSMENT:    1. Coronary artery disease involving native coronary artery of native heart without angina pectoris   2. Mixed hyperlipidemia   3. LBBB (left bundle branch block)    PLAN:    In order of problems listed above:  Stable, no clear angina at present. Last cath result reviewed with no flow-limiting stenoses identified. Continue ASA, atorvastatin, zetia, and metoprolol. Treated with atorvastatin and zetia. Last lipids reviewed 03/2020 with chol 147, LDL 86, HDL 48. HgB A1C 6.3, transaminases normal, creatinine 0.95.  Unchanged from past.  Comparison made to old EKG.  Overall the patient appears stable from a cardiovascular perspective.  We reviewed his most recent cardiac catheterization films together today.  I demonstrated his focal area of moderate in-stent restenosis in the proximal  RCA.  We will continue to manage him medically.  If he has progressive angina, he is directed to seek immediate medical attention.   Medication Adjustments/Labs and Tests Ordered: Current medicines are reviewed at length with the patient today.  Concerns regarding medicines are outlined above.  No orders of the defined types were placed in this encounter.  No orders of the defined types were placed in this encounter.   There are no Patient Instructions on file for this visit.   Signed, [name], MD  01/08/2021 9:09 AM    [place]

## 2021-01-08 NOTE — Patient Instructions (Signed)
Medication Instructions:  Your physician recommends that you continue on your current medications as directed. Please refer to the Current Medication list given to you today.  *If you need a refill on your cardiac medications before your next appointment, please call your pharmacy*   Follow-Up: At [facility], you and your health needs are our priority.  As part of our continuing mission to provide you with exceptional heart care, we have created designated Provider Care Teams.  These Care Teams include your primary Cardiologist (physician) and Advanced Practice Providers (APPs -  Physician Assistants and Nurse Practitioners) who all work together to provide you with the care you need, when you need it.  We recommend signing up for the patient portal called "MyChart".  Sign up information is provided on this After Visit Summary.  MyChart is used to connect with patients for Virtual Visits (Telemedicine).  Patients are able to view lab/test results, encounter notes, upcoming appointments, etc.  Non-urgent messages can be sent to your provider as well.   To learn more about what you can do with MyChart, go to [email or web address].    Your next appointment:   1 year(s)  The format for your next appointment:   In Person  Provider:   Michael Cooper, MD {        

## 2021-02-22 ENCOUNTER — Other Ambulatory Visit: Payer: Self-pay | Admitting: Physician Assistant

## 2021-06-01 ENCOUNTER — Other Ambulatory Visit: Payer: Self-pay | Admitting: Physician Assistant

## 2021-06-01 DIAGNOSIS — I25119 Atherosclerotic heart disease of native coronary artery with unspecified angina pectoris: Secondary | ICD-10-CM

## 2021-12-05 ENCOUNTER — Other Ambulatory Visit: Payer: Self-pay | Admitting: Physician Assistant

## 2022-01-27 ENCOUNTER — Encounter: Payer: Self-pay | Admitting: Cardiovascular Disease

## 2022-01-27 ENCOUNTER — Ambulatory Visit: Payer: PPO | Attending: Cardiovascular Disease | Admitting: Cardiovascular Disease

## 2022-01-27 VITALS — BP 120/76 | HR 66 | Ht 66.75 in | Wt 211.8 lb

## 2022-01-27 DIAGNOSIS — I25118 Atherosclerotic heart disease of native coronary artery with other forms of angina pectoris: Secondary | ICD-10-CM

## 2022-01-27 DIAGNOSIS — I447 Left bundle-branch block, unspecified: Secondary | ICD-10-CM | POA: Diagnosis not present

## 2022-01-27 DIAGNOSIS — E782 Mixed hyperlipidemia: Secondary | ICD-10-CM

## 2022-01-27 NOTE — Progress Notes (Signed)
Cardiology Office Note:    Date:  01/27/2022   ID:  Lance Hancock, DOB [birth year], MRN [id number]  PCP:  Kathyrn Lass   [place] Providers Cardiologist:  [name], MD     Referring MD: No ref. provider found   Chief Complaint  Patient presents with   Coronary Artery Disease    History of Present Illness:    Lance Hancock is a 81 y.o. male with a hx of coronary artery disease, presenting for follow-up evaluation.  The patient initially presented in 2017 with an inferior STEMI, treated with primary PCI using a drug-eluting stent in the right coronary artery.  The patient developed recurrent angina in 2021 and underwent repeat cardiac catheterization demonstrating moderate in-stent restenosis in the proximal RCA with negative FFR evaluation, patency of the left main, LAD, and left circumflex with only minor nonobstructive disease in his vessels.  LVEDP was normal and ongoing medical therapy was recommended.  The patient is here alone today.  He admits to some limitation from low back and hip arthritis.  He has not had any recent problems with chest pain or pressure, dyspnea, or heart palpitations.  On rare occasion he has had some jaw discomfort but this has not been associated with physical activity.  This has been his anginal equivalent but he states it is much less than in the past. Last year, we talked about his Human resources officer. This year, he brought me a Human resources officer as a Education officer, community.   Past Medical History:  Diagnosis Date   CAD (coronary artery disease)    a. STEMI 08/2015 >> LHC:  oLM 30, mLAD 30, pLCx 25, pRCA 99, EF 55-60 inf HK >> PCI:  3.5x31m Promus DES to prox RCA, 3.0x248mPromus DES to prox RCA.  // Myoview 04/2018:  Normal perfusion, EF 63; Low Risk    History of acute inferior wall MI 08/15/2015   History of echocardiogram    a. Echo 5/17: EF 55-60%, normal wall motion, normal diastolic function, MAC, moderate LAE   History of nuclear stress test    Myoview  5/18: Medium size, moderate intensity fixed septal defect - likely LBBB-related. No reversible ischemia. LVEF 53% with normal wall motion. This is a low risk study.   HLD (hyperlipidemia)     Past Surgical History:  Procedure Laterality Date   BREAST SURGERY     age 89  CARDIAC CATHETERIZATION N/A 08/15/2015   Procedure: Left Heart Cath and Coronary Angiography;  Surgeon: MiSherren MochaMD;  [facility];  Service: Cardiovascular;  Laterality: N/A;   CARDIAC CATHETERIZATION N/A 08/15/2015   Procedure: Coronary Stent Intervention;  Surgeon: MiSherren MochaMD;  [facility];  Service: Cardiovascular;  Laterality: N/A;  Proximal RCA  (3.5/16 and 3.0/20 Promus)   EYE SURGERY     INTRAVASCULAR PRESSURE WIRE/FFR STUDY N/A 12/30/2019   Procedure: INTRAVASCULAR PRESSURE WIRE/FFR STUDY;  Surgeon: CoSherren MochaMD;  [facility];  Service: Cardiovascular;  Laterality: N/A;   LEFT HEART CATH AND CORONARY ANGIOGRAPHY N/A 12/30/2019   Procedure: LEFT HEART CATH AND CORONARY ANGIOGRAPHY;  Surgeon: CoSherren MochaMD;  [facility];  Service: Cardiovascular;  Laterality: N/A;    Current Medications: Current Meds  Medication Sig   aspirin 81 MG tablet Take 81 mg by mouth daily.   atorvastatin (LIPITOR) 80 MG tablet TAKE 1 TABLET(80 MG) BY MOUTH DAILY AT 6 PM   Coenzyme Q10 200 MG capsule Take  200 mg by mouth daily.   ELDERBERRY PO Take 15 mLs by mouth daily.    ezetimibe (ZETIA) 10 MG tablet TAKE 1 TABLET(10 MG) BY MOUTH DAILY   GLUCOSAMINE-CHONDROITIN PO Take 1 tablet by mouth daily.    metoprolol tartrate (LOPRESSOR) 25 MG tablet TAKE 1/2 TABLET BY MOUTH TWICE DAILY   Multiple Vitamin (MULTIVITAMIN) tablet Takes 1 tablespoon  Reliv vitamin And herbs by mouth daily   nitroGLYCERIN (NITROSTAT) 0.4 MG SL tablet DISSOLVE 1 TABLET UNDER THE TONGUE EVERY 5 MINUTES UP TO 3 DOSES AS NEEDED FOR CHEST PAIN   Omega-3 Fatty Acids (FISH OIL ULTRA)  1400 MG CAPS Take 1,400 mg by mouth 2 (two) times daily.     Allergies:   Tetracyclines & related   Social History   Socioeconomic History   Marital status: Married    Spouse name: Not on file   Number of children: Not on file   Years of education: Not on file   Highest education level: Not on file  Occupational History   Not on file  Tobacco Use   Smoking status: Former    Packs/day: 2.00    Years: 18.00    Total pack years: 36.00    Types: Cigarettes    Quit date: 01/19/1976    Years since quitting: 46.0   Smokeless tobacco: Former  Scientific laboratory technician Use: Never used  Substance and Sexual Activity   Alcohol use: Yes    Comment: occasional   Drug use: No   Sexual activity: Not on file  Other Topics Concern   Not on file  Social History Narrative   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   Social Determinants of Health   Financial Resource Strain: Not on file  Food Insecurity: Not on file  Transportation Needs: Not on file  Physical Activity: Not on file  Stress: Not on file  Social Connections: Not on file     Family History: The patient's family history includes Heart disease in his sister; Hyperlipidemia in his mother; Hypertension in his mother.  ROS:   Please see the history of present illness.    All other systems reviewed and are negative.  EKGs/Labs/Other Studies Reviewed:    The following studies were reviewed today: Cardiac catheterization 12/30/2019: 1.  Single-vessel coronary artery disease with moderate 50% restenosis in the proximal RCA, negative FFR evaluation on today's study 2.  Patent left main, LAD, and left circumflex with minor nonobstructive coronary artery disease 3.  Normal LVEDP   Recommend: Ongoing medical therapy.  EKG:  EKG is ordered today.  The ekg ordered today demonstrates normal sinus rhythm 66 bpm, first-degree AV block, left bundle branch block.  No significant change  from previous tracings.  Recent Labs: No results found for requested labs within last 365 days.  Recent Lipid Panel    Component Value Date/Time   CHOL 138 08/15/2015 0351   TRIG 26 08/15/2015 0351   HDL 43 08/15/2015 0351   CHOLHDL 3.2 08/15/2015 0351   VLDL 5 08/15/2015 0351   LDLCALC 90 08/15/2015 0351     Risk Assessment/Calculations:                Physical Exam:    VS:  BP 120/76   Pulse 66   Ht 5' 6.75" (1.695 m)   Wt 211 lb 12.8 oz (96.1 kg)   SpO2 95%   BMI 33.42 kg/m  Wt Readings from Last 3 Encounters:  01/27/22 211 lb 12.8 oz (96.1 kg)  01/08/21 215 lb 6.4 oz (97.7 kg)  01/18/20 218 lb 9.6 oz (99.2 kg)     GEN:  Well nourished, well developed in no acute distress HEENT: Normal NECK: No JVD; No carotid bruits LYMPHATICS: No lymphadenopathy CARDIAC: RRR, no murmurs, rubs, gallops RESPIRATORY:  Clear to auscultation without rales, wheezing or rhonchi  ABDOMEN: Soft, non-tender, non-distended MUSCULOSKELETAL:  No edema; No deformity  SKIN: Warm and dry NEUROLOGIC:  Alert and oriented x 3 PSYCHIATRIC:  Normal affect   ASSESSMENT:    1. Coronary artery disease of native artery of native heart with stable angina pectoris ([place])   2. Mixed hyperlipidemia   3. LBBB (left bundle branch block)    PLAN:    In order of problems listed above:  1.  Patient will continue his current medical regimen with aspirin for antiplatelet therapy, beta-blocker, and high intensity statin drug. 2.  Treated with atorvastatin.  Most recent lipids reviewed through care everywhere with a cholesterol of 127, LDL 52, HDL 43.  Triglycerides were 200.  He will continue his current medicines with atorvastatin 80 mg and ezetimibe 10 mg daily 3.  Unchanged.  I compared to his past EKGs.  He is given a copy of his EKG today.             Medication Adjustments/Labs and Tests Ordered: Current medicines are reviewed at length with the patient today.  Concerns regarding  medicines are outlined above.  No orders of the defined types were placed in this encounter.  No orders of the defined types were placed in this encounter.   There are no Patient Instructions on file for this visit.   Signed, [name], MD  01/27/2022 3:09 PM    [place]

## 2022-01-27 NOTE — Patient Instructions (Signed)
Medication Instructions:  Your physician recommends that you continue on your current medications as directed. Please refer to the Current Medication list given to you today.  *If you need a refill on your cardiac medications before your next appointment, please call your pharmacy*   Lab Work: NONE If you have labs (blood work) drawn today and your tests are completely normal, you will receive your results only by: MyChart Message (if you have MyChart) OR A paper copy in the mail If you have any lab test that is abnormal or we need to change your treatment, we will call you to review the results.   Testing/Procedures: NONE   Follow-Up: At [facility], you and your health needs are our priority.  As part of our continuing mission to provide you with exceptional heart care, we have created designated Provider Care Teams.  These Care Teams include your primary Cardiologist (physician) and Advanced Practice Providers (APPs -  Physician Assistants and Nurse Practitioners) who all work together to provide you with the care you need, when you need it.  Your next appointment:   1 year(s)  The format for your next appointment:   In Person  Provider:   Michael Cooper, MD  or APP     Important Information About Sugar       

## 2022-02-25 ENCOUNTER — Other Ambulatory Visit: Payer: Self-pay | Admitting: Physician Assistant

## 2022-02-25 ENCOUNTER — Other Ambulatory Visit: Payer: Self-pay | Admitting: Cardiovascular Disease

## 2022-02-25 DIAGNOSIS — I25119 Atherosclerotic heart disease of native coronary artery with unspecified angina pectoris: Secondary | ICD-10-CM

## 2022-10-21 ENCOUNTER — Other Ambulatory Visit: Payer: Self-pay

## 2022-10-21 DIAGNOSIS — I25119 Atherosclerotic heart disease of native coronary artery with unspecified angina pectoris: Secondary | ICD-10-CM

## 2022-10-21 MED ORDER — NITROGLYCERIN 0.4 MG SL SUBL
SUBLINGUAL_TABLET | SUBLINGUAL | 4 refills | Status: AC
Start: 2022-10-21 — End: ?

## 2023-02-02 ENCOUNTER — Encounter: Payer: Self-pay | Admitting: Cardiovascular Disease

## 2023-02-02 ENCOUNTER — Ambulatory Visit: Payer: PPO | Attending: Cardiovascular Disease | Admitting: Cardiovascular Disease

## 2023-02-02 VITALS — BP 152/68 | HR 63 | Ht 67.5 in | Wt 210.4 lb

## 2023-02-02 DIAGNOSIS — I10 Essential (primary) hypertension: Secondary | ICD-10-CM

## 2023-02-02 DIAGNOSIS — E782 Mixed hyperlipidemia: Secondary | ICD-10-CM | POA: Diagnosis not present

## 2023-02-02 DIAGNOSIS — I2510 Atherosclerotic heart disease of native coronary artery without angina pectoris: Secondary | ICD-10-CM | POA: Diagnosis not present

## 2023-02-02 NOTE — Patient Instructions (Signed)

## 2023-02-02 NOTE — Assessment & Plan Note (Signed)
Patient is stable without angina.  He continues on aspirin for antiplatelet therapy, beta-blocker, and high intensity statin drug.

## 2023-02-02 NOTE — Progress Notes (Signed)
Cardiology Office Note:    Date:  02/02/2023   ID:  Lance Hancock, DOB 10/23/1940, MRN [id number]  PCP:  Aviva Kluver   [facility] Providers Cardiologist:  Tonny Bollman, MD     Referring MD: No ref. provider found   Chief Complaint  Patient presents with   Coronary Artery Disease    History of Present Illness:    Lance Hancock is a 82 y.o. male presenting for follow-up of coronary artery disease.  The patient initially presented in 2017 with an inferior wall STEMI and was treated with primary PCI of the RCA.  He had a repeat heart catheterization in 2021 when he developed recurrent angina and was found to have moderate in-stent restenosis in the proximal RCA with negative FFR evaluation.  He had no other significant disease in the left coronary distribution.  LVEDP was normal and continued medical therapy was recommended.  The patient has done well since that time.  He is here alone today for follow-up evaluation.  He denies any recurrence of chest pain, chest pressure, jaw pain, shortness of breath, or heart palpitations.   Current Medications: Current Meds  Medication Sig   aspirin 81 MG tablet Take 81 mg by mouth daily.   atorvastatin (LIPITOR) 80 MG tablet TAKE 1 TABLET(80 MG) BY MOUTH DAILY AT 6 PM   Coenzyme Q10 200 MG capsule Take 200 mg by mouth daily.   ELDERBERRY PO Take 15 mLs by mouth daily.    ezetimibe (ZETIA) 10 MG tablet TAKE 1 TABLET(10 MG) BY MOUTH DAILY   GLUCOSAMINE-CHONDROITIN PO Take 1 tablet by mouth daily.    metoprolol tartrate (LOPRESSOR) 25 MG tablet TAKE 1/2 TABLET BY MOUTH TWICE DAILY   Multiple Vitamin (MULTIVITAMIN) tablet Takes 1 tablespoon  Reliv vitamin And herbs by mouth daily   nitroGLYCERIN (NITROSTAT) 0.4 MG SL tablet DISSOLVE 1 TABLET UNDER THE TONGUE EVERY 5 MINUTES UP TO 3 DOSES AS NEEDED FOR CHEST PAIN   Omega-3 Fatty Acids (FISH OIL ULTRA) 1400 MG CAPS Take 1,400 mg by mouth 2 (two) times daily.     Allergies:   Tetracyclines &  related   ROS:   Please see the history of present illness.    All other systems reviewed and are negative.  EKGs/Labs/Other Studies Reviewed:    The following studies were reviewed today: Cardiac Studies & Procedures   CARDIAC CATHETERIZATION  CARDIAC CATHETERIZATION 08/15/2015  Narrative  Prox RCA lesion, 99% stenosed. Post intervention, there is a 0% residual stenosis.  Ost LM lesion, 30% stenosed.  There is mild left ventricular systolic dysfunction.  Mid LAD to Dist LAD lesion, 30% stenosed.  Prox Cx to Mid Cx lesion, 25% stenosed.  1. Subtotal occlusion of the RCA with TIMI 2 flow, treated successfully with overlapping drug-eluting stents in the proximal vessel 2. Mild nonobstructive plaque in the left main and LAD 3. Mild segmental LV contraction abnormality with preserved overall LV function  Recommendations: Dual antiplatelet therapy with aspirin and Brilinta for 12 months, post MI medical therapy, if stable the patient is candidate for hospital discharge in 48 hours.  Findings Coronary Findings Diagnostic  Dominance: Right  Left Main  Left Anterior Descending Diffuse.  Ramus Intermedius . Vessel is small.  Left Circumflex  Right Coronary Artery  Intervention  Prox RCA lesion Angioplasty Supplies used: STENT PROMUS PREM MR 3.0X20; STENT PROMUS PREM MR 3.5X16 PCI The pre-interventional distal flow is decreased (TIMI 2). Pre-stent angioplasty was performed. A drug-eluting stent was placed. Post-stent  angioplasty was performed. Maximum pressure: 16 atm. The post-interventional distal flow is normal (TIMI 3). The intervention was successful. No complications occurred at this lesion. There is a critical stenosis of the proximal RCA. TIMI 2 flow is present at baseline. There is associated thrombus. Heparin was used for anticoagulation. A JR4 guide catheter is used interventional procedure. A therapeutic ACT is achieved. A cougar wire was used to cross the lesion  with a moderate amount of difficulty. The lesion was predilated with a 2.0 mm balloon. There is heavy thrombus visible after balloon dilatation. Aspiration thrombectomy is performed. An Aggrastat boluses administered intravenously. The patient developed severe bradycardia and hypotension after aspiration thrombectomy and required atropine 1 mg. Fluids were run wide open. He responded within a few minutes. The lesion was then stented with a 3.0 x 20 mm Promus DES deployed at 16 atm. There was some residual disease in the proximal RCA within a few millimeters of the stented segment. The patient was treated with an overlapping 3.5 x 16 mm Promus DES proximally. The entire stented segment was then postdilated with a 3.75 mm noncompliant balloon to a maximum pressure of 14 atm. At the completion of the procedure there is TIMI-3 flow, 0% residual stenosis, and no complications. There is a 0% residual stenosis post intervention.   CARDIAC CATHETERIZATION  CARDIAC CATHETERIZATION 12/30/2019  Narrative 1.  Single-vessel coronary artery disease with moderate 50% restenosis in the proximal RCA, negative FFR evaluation on today's study 2.  Patent left main, LAD, and left circumflex with minor nonobstructive coronary artery disease 3.  Normal LVEDP  Recommend: Ongoing medical therapy.  Findings Coronary Findings Diagnostic  Dominance: Right  Left Main Ost LM lesion is 30% stenosed.  Left Anterior Descending Mid LAD to Dist LAD lesion is 30% stenosed. The lesion is segmental.  Ramus Intermedius Vessel is small.  Left Circumflex Prox Cx to Mid Cx lesion is 25% stenosed.  Right Coronary Artery There is moderate focal in-stent restenosis in the proximal right coronary artery.  A pressure wire analysis is performed.  Pressure wire analysis is negative for ischemia.  The DFR is 0.98 and the FFR with IV adenosine is 0.89. Prox RCA lesion is 50% stenosed. The lesion was previously treated using a drug  eluting stent over 2 years ago.  Intervention  No interventions have been documented.   STRESS TESTS  MYOCARDIAL PERFUSION IMAGING 05/10/2018  Narrative  Nuclear stress EF: 63%.  The study is normal.  This is a low risk study.  Low risk stress nuclear study with normal perfusion and normal left ventricular regional and global systolic function. Interestingly, although LBBB is still present, the previously reported LBBB related artifact is no longer seen.   ECHOCARDIOGRAM  ECHOCARDIOGRAM COMPLETE 09/11/2015  Narrative *[facility]* [address] [place], [place] [address] [phone]  ------------------------------------------------------------------- Transthoracic Echocardiography  Patient:    Fender, Stansberry MR #:       [id number] Study Date: 09/11/2015 Gender:     M Age:        5 Height:     172.7 cm Weight:     94.8 kg BSA:        2.16 m^2 Pt. Status: Room:  SONOGRAPHER  Holton, Will ATTENDING    Alben Spittle, Scott T Doreene Adas, Scott T REFERRING    Alben Spittle, Scott T PERFORMING   [facility], Outpatient  cc:  ------------------------------------------------------------------- LV EF: 55% -   60%  ------------------------------------------------------------------- Indications:      (  I25.2).  ------------------------------------------------------------------- History:   PMH:  Acquired from the patient and from the patient&'s chart.  Coronary artery disease.  PMH:   Myocardial infarction. Risk factors:  Former tobacco use. Dyslipidemia.  ------------------------------------------------------------------- Study Conclusions  - Left ventricle: The cavity size was normal. Systolic function was normal. The estimated ejection fraction was in the range of 55% to 60%. Wall motion was normal; there were no regional wall motion abnormalities. Left ventricular diastolic function parameters were normal. - Mitral valve: Calcified annulus. Mildly thickened  leaflets . - Left atrium: The atrium was moderately dilated. - Atrial septum: No defect or patent foramen ovale was identified.  Transthoracic echocardiography.  M-mode, complete 2D, spectral Doppler, and color Doppler.  Birthdate:  Patient birthdate: 1940-11-12.  Age:  Patient is 82 yr old.  Sex:  Gender: male. BMI: 31.8 kg/m^2.  Blood pressure:     140/60  Patient status: Outpatient.  Study date:  Study date: 09/11/2015. Study time: 02:59 PM.  Location:  [facility]  -------------------------------------------------------------------  ------------------------------------------------------------------- Left ventricle:  The cavity size was normal. Systolic function was normal. The estimated ejection fraction was in the range of 55% to 60%. Wall motion was normal; there were no regional wall motion abnormalities. The transmitral flow pattern was normal. The deceleration time of the early transmitral flow velocity was normal. The pulmonary vein flow pattern was normal. The tissue Doppler parameters were normal. Left ventricular diastolic function parameters were normal.  ------------------------------------------------------------------- Aortic valve:   Structurally normal valve. Trileaflet; normal thickness leaflets. Cusp separation was normal. Mobility was not restricted.  Doppler:  Transvalvular velocity was within the normal range. There was no stenosis. There was no regurgitation.  ------------------------------------------------------------------- Aorta:  The aorta was normal, not dilated, and non-diseased. Aortic root: The aortic root was normal in size.  ------------------------------------------------------------------- Mitral valve:   Calcified annulus. Mildly thickened leaflets . Mobility was not restricted.  Doppler:  Transvalvular velocity was within the normal range. There was no evidence for stenosis. There was trivial regurgitation.    Peak gradient (D): 3 mm  Hg.  ------------------------------------------------------------------- Left atrium:  The atrium was moderately dilated.  ------------------------------------------------------------------- Atrial septum:  No defect or patent foramen ovale was identified.  ------------------------------------------------------------------- Right ventricle:  The cavity size was normal. Wall thickness was normal. Systolic function was normal.  ------------------------------------------------------------------- Pulmonic valve:    Doppler:  Transvalvular velocity was within the normal range. There was no evidence for stenosis. There was trivial regurgitation.  ------------------------------------------------------------------- Tricuspid valve:   Structurally normal valve.    Doppler: Transvalvular velocity was within the normal range. There was mild regurgitation.  ------------------------------------------------------------------- Pulmonary artery:   The main pulmonary artery was normal-sized. Systolic pressure was within the normal range.  ------------------------------------------------------------------- Right atrium:  The atrium was normal in size.  ------------------------------------------------------------------- Pericardium:  The pericardium was normal in appearance. There was no pericardial effusion.  ------------------------------------------------------------------- Systemic veins: Inferior vena cava: The vessel was normal in size.  ------------------------------------------------------------------- Post procedure conclusions Ascending Aorta:  - The aorta was normal, not dilated, and non-diseased.  ------------------------------------------------------------------- Measurements  Left ventricle                           Value        Reference LV ID, ED, PLAX chordal                  44.9  mm     43 - 52 LV ID, ES, PLAX chordal  27    mm     23 - 38 LV fx  shortening, PLAX chordal           40    %      >=29 LV PW thickness, ED                      11.4  mm     --------- IVS/LV PW ratio, ED                      1            <=1.3 Stroke volume, 2D                        79    ml     --------- Stroke volume/bsa, 2D                    37    ml/m^2 --------- LV ejection fraction, 1-p A4C            60    %      --------- LV end-diastolic volume, 2-p             90    ml     --------- LV end-systolic volume, 2-p              35    ml     --------- LV ejection fraction, 2-p                61    %      --------- Stroke volume, 2-p                       55    ml     --------- LV end-diastolic volume/bsa, 2-p         42    ml/m^2 --------- LV end-systolic volume/bsa, 2-p          16    ml/m^2 --------- Stroke volume/bsa, 2-p                   25.4  ml/m^2 --------- LV e&', lateral                           6.73  cm/s   --------- LV E/e&', lateral                         13.27        --------- LV e&', medial                            6.82  cm/s   --------- LV E/e&', medial                          13.09        --------- LV e&', average                           6.78  cm/s   --------- LV E/e&', average                         13.18        ---------  Ventricular septum  Value        Reference IVS thickness, ED                        11.4  mm     ---------  LVOT                                     Value        Reference LVOT ID, S                               21    mm     --------- LVOT area                                3.46  cm^2   --------- LVOT ID                                  21    mm     --------- LVOT peak velocity, S                    96.3  cm/s   --------- LVOT mean velocity, S                    64.7  cm/s   --------- LVOT VTI, S                              22.8  cm     --------- LVOT peak gradient, S                    4     mm Hg  --------- Stroke volume (SV), LVOT DP              79    ml      --------- Stroke index (SV/bsa), LVOT DP           36.5  ml/m^2 ---------  Aorta                                    Value        Reference Aortic root ID, ED                       33    mm     --------- Ascending aorta ID, A-P, S               30    mm     ---------  Left atrium                              Value        Reference LA ID, A-P, ES                           44    mm     --------- LA ID/bsa, A-P  2.03  cm/m^2 <=2.2 LA volume, S                             72    ml     --------- LA volume/bsa, S                         33.3  ml/m^2 --------- LA volume, ES, 1-p A4C                   57    ml     --------- LA volume/bsa, ES, 1-p A4C               26.4  ml/m^2 --------- LA volume, ES, 1-p A2C                   86    ml     --------- LA volume/bsa, ES, 1-p A2C               39.8  ml/m^2 ---------  Mitral valve                             Value        Reference Mitral E-wave peak velocity              89.3  cm/s   --------- Mitral A-wave peak velocity              94.8  cm/s   --------- Mitral deceleration time         (H)     324   ms     150 - 230 Mitral peak gradient, D                  3     mm Hg  --------- Mitral E/A ratio, peak                   0.9          ---------  Pulmonary arteries                       Value        Reference PA pressure, S, DP                       30    mm Hg  <=30  Tricuspid valve                          Value        Reference Tricuspid regurg peak velocity           262   cm/s   --------- Tricuspid peak RV-RA gradient            27    mm Hg  ---------  Systemic veins                           Value        Reference Estimated CVP                            3     mm Hg  ---------  Right ventricle  Value        Reference RV pressure, S, DP                       30    mm Hg  <=30 RV s&', lateral, S                        18.3  cm/s   ---------  Legend: (L)  and  (H)  mark values outside  specified reference range.  ------------------------------------------------------------------- Prepared and Electronically Authenticated by  Charlton Haws, M.D. 2017-05-30T15:51:14             EKG:   EKG Interpretation Date/Time:  Monday February 02 2023 10:24:53 EDT Ventricular Rate:  61 PR Interval:  222 QRS Duration:  162 QT Interval:  444 QTC Calculation: 446 R Axis:   1  Text Interpretation: Sinus rhythm with sinus arrhythmia with 1st degree A-V block Left bundle branch block When compared with ECG of 30-Dec-2019 08:13, Premature ventricular complexes are no longer Present QRS axis Shifted right no other significant changes noted Confirmed by Tonny Bollman [phone]) on 02/02/2023 10:36:16 AM    Recent Labs: No results found for requested labs within last 365 days.  Recent Lipid Panel    Component Value Date/Time   CHOL 138 08/15/2015 0351   TRIG 26 08/15/2015 0351   HDL 43 08/15/2015 0351   CHOLHDL 3.2 08/15/2015 0351   VLDL 5 08/15/2015 0351   LDLCALC 90 08/15/2015 0351           Physical Exam:    VS:  BP (!) 152/68   Pulse 63   Ht 5' 7.5" (1.715 m)   Wt 210 lb 6.4 oz (95.4 kg)   SpO2 94%   BMI 32.47 kg/m     Wt Readings from Last 3 Encounters:  02/02/23 210 lb 6.4 oz (95.4 kg)  01/27/22 211 lb 12.8 oz (96.1 kg)  01/08/21 215 lb 6.4 oz (97.7 kg)     GEN:  Well nourished, well developed in no acute distress HEENT: Normal NECK: No JVD; No carotid bruits LYMPHATICS: No lymphadenopathy CARDIAC: RRR, no murmurs, rubs, gallops RESPIRATORY:  Clear to auscultation without rales, wheezing or rhonchi  ABDOMEN: Soft, non-tender, non-distended MUSCULOSKELETAL:  No edema; No deformity  SKIN: Warm and dry NEUROLOGIC:  Alert and oriented x 3 PSYCHIATRIC:  Normal affect   Assessment & Plan Coronary artery disease involving native coronary artery of native heart without angina pectoris Patient is stable without angina.  He continues on aspirin for antiplatelet  therapy, beta-blocker, and high intensity statin drug. Mixed hyperlipidemia Lipids are personally reviewed and his LDL cholesterol is 51 mg/dL.  He continues on a combination of Zetia and atorvastatin. Essential hypertension Treated with metoprolol.  Component of whitecoat hypertension noted.  My repeat blood pressure today was 160/82.  We reviewed his home readings which are typically in the 130s over 80s.  He states that on occasion he will have blood pressures in the 140s up to 150 mmHg, but this is when he checks his blood pressure without sitting down to rest before hand.  Continue current management.            Medication Adjustments/Labs and Tests Ordered: Current medicines are reviewed at length with the patient today.  Concerns regarding medicines are outlined above.  Orders Placed This Encounter  Procedures   EKG 12-Lead   No orders of the defined types were placed in  this encounter.   Patient Instructions  Follow-Up: At [facility], you and your health needs are our priority.  As part of our continuing mission to provide you with exceptional heart care, we have created designated Provider Care Teams.  These Care Teams include your primary Cardiologist (physician) and Advanced Practice Providers (APPs -  Physician Assistants and Nurse Practitioners) who all work together to provide you with the care you need, when you need it.  Your next appointment:   1 year(s)  Provider:   Tonny Bollman, MD        Signed, Tonny Bollman, MD  02/02/2023 12:54 PM    [facility]

## 2023-02-02 NOTE — Assessment & Plan Note (Signed)
Lipids are personally reviewed and his LDL cholesterol is 51 mg/dL.  He continues on a combination of Zetia and atorvastatin.

## 2023-03-01 ENCOUNTER — Other Ambulatory Visit: Payer: Self-pay | Admitting: Cardiovascular Disease

## 2023-03-04 ENCOUNTER — Other Ambulatory Visit: Payer: Self-pay | Admitting: Physician Assistant

## 2023-03-04 DIAGNOSIS — I25119 Atherosclerotic heart disease of native coronary artery with unspecified angina pectoris: Secondary | ICD-10-CM

## 2023-03-05 ENCOUNTER — Other Ambulatory Visit: Payer: Self-pay

## 2023-03-05 MED ORDER — METOPROLOL TARTRATE 25 MG PO TABS: 12.5000 mg | ORAL_TABLET | Freq: Two times a day (BID) | ORAL | 3 refills | Status: DC

## 2024-02-15 ENCOUNTER — Ambulatory Visit: Admitting: Physician Assistant

## 2024-02-25 ENCOUNTER — Other Ambulatory Visit: Payer: Self-pay | Admitting: Cardiovascular Disease

## 2024-02-25 DIAGNOSIS — I25119 Atherosclerotic heart disease of native coronary artery with unspecified angina pectoris: Secondary | ICD-10-CM

## 2024-03-02 ENCOUNTER — Telehealth: Payer: Self-pay | Admitting: Cardiovascular Disease

## 2024-03-02 MED ORDER — EZETIMIBE 10 MG PO TABS: 10.0000 mg | ORAL_TABLET | Freq: Every day | ORAL | 0 refills | Status: AC

## 2024-03-02 NOTE — Telephone Encounter (Signed)
 Refill sent.

## 2024-03-02 NOTE — Telephone Encounter (Signed)
*  STAT* If patient is at the pharmacy, call can be transferred to refill team.   1. Which medications need to be refilled? (please list name of each medication and dose if known)   ezetimibe  (ZETIA ) 10 MG tablet      4. Which pharmacy/location (including street and city if local pharmacy) is medication to be sent to?  [id number] - [place], [place] - [address]     5. Do they need a 30 day or 90 day supply? 90    Sch 12/16

## 2024-03-28 NOTE — Progress Notes (Unsigned)
 Cardiology Office Note:    Date:  03/29/2024   ID:  Lance Hancock, DOB 10-15-1940, MRN [id number]  PCP:  Freddrick Johns   [facility] Providers Cardiologist:  Ozell Fell, MD { Click to update primary MD,subspecialty MD or APP then REFRESH:1}    Referring MD: No ref. provider found   Chief complaint: 1 year follow-up     History of Present Illness:   Lance Hancock is a 83 y.o. male with a hx of CAD s/p inferior STEMI (2017), HLD, HTN presenting to office today for follow-up of chronic cardiac conditions.  The patient initially presented in 2017 with an inferior wall STEMI and was treated with primary PCI of the RCA. He had a repeat heart catheterization in 2021 when he developed recurrent angina and was found to have moderate in-stent restenosis in the proximal RCA with negative FFR evaluation. He had no other significant disease in the left coronary distribution. LVEDP was normal and continued medical therapy was recommended. The patient has done well since that time.   Last seen in the cardiology office 02/02/2023 by Dr. Fell, no cardiovascular complaints at that time while taking aspirin , atorvastatin , metoprolol  tartrate, Zetia .  Presents independently, appears stable from a cardiovascular standpoint.  He described chest pain, central, occurring with moderate-heavy intensity activities, primarily when working in the yard, relieved with rest.  Noticed more DOE when raking the leaves in the front yard this week, relieved with rest.  Reports intermittent, jaw pain that was his anginal equivalent when he had his first STEMI.  States this is happening infrequently, but more noticeably over the last few months, occurring at random, with no aggravating or alleviating factors.  Denies palpitations, dizziness, near-syncope, dark/tarry/bloody stools, hematuria, weight changes.  Reports bilateral lower extremity edema, chronic, unchanged from his baseline, improved with leg elevation. BPs  averaging in the 120s-140s systolic, left his log at home, checks them frequently.  ROS:   Please see the history of present illness.    All other systems reviewed and are negative.     Past Medical History:  Diagnosis Date   CAD (coronary artery disease)    a. STEMI 08/2015 >> LHC:  oLM 30, mLAD 30, pLCx 25, pRCA 99, EF 55-60 inf HK >> PCI:  3.5x70mm Promus DES to prox RCA, 3.0x13mm Promus DES to prox RCA.  // Myoview  04/2018:  Normal perfusion, EF 63; Low Risk    History of acute inferior wall MI 08/15/2015   History of echocardiogram    a. Echo 5/17: EF 55-60%, normal wall motion, normal diastolic function, MAC, moderate LAE   History of nuclear stress test    Myoview  5/18: Medium size, moderate intensity fixed septal defect - likely LBBB-related. No reversible ischemia. LVEF 53% with normal wall motion. This is a low risk study.   HLD (hyperlipidemia)     Past Surgical History:  Procedure Laterality Date   BREAST SURGERY     age 74.   CARDIAC CATHETERIZATION N/A 08/15/2015   Procedure: Left Heart Cath and Coronary Angiography;  Surgeon: Ozell Fell, MD;  Location: [facility] INVASIVE CV LAB;  Service: Cardiovascular;  Laterality: N/A;   CARDIAC CATHETERIZATION N/A 08/15/2015   Procedure: Coronary Stent Intervention;  Surgeon: Ozell Fell, MD;  Location: [facility] INVASIVE CV LAB;  Service: Cardiovascular;  Laterality: N/A;  Proximal RCA  (3.5/16 and 3.0/20 Promus)   CORONARY PRESSURE/FFR STUDY N/A 12/30/2019   Procedure: INTRAVASCULAR PRESSURE WIRE/FFR STUDY;  Surgeon: Fell Ozell, MD;  Location: [facility] INVASIVE CV LAB;  Service: Cardiovascular;  Laterality: N/A;   EYE SURGERY     LEFT HEART CATH AND CORONARY ANGIOGRAPHY N/A 12/30/2019   Procedure: LEFT HEART CATH AND CORONARY ANGIOGRAPHY;  Surgeon: Wonda Sharper, MD;  Location: [facility] INVASIVE CV LAB;  Service: Cardiovascular;  Laterality: N/A;    Current Medications: Active Medications[1]   Allergies:   Tetracyclines & related   Social  History   Socioeconomic History   Marital status: Married    Spouse name: Not on file   Number of children: Not on file   Years of education: Not on file   Highest education level: Not on file  Occupational History   Not on file  Tobacco Use   Smoking status: Former    Current packs/day: 0.00    Average packs/day: 2.0 packs/day for 18.0 years (36.0 ttl pk-yrs)    Types: Cigarettes    Start date: 01/18/1958    Quit date: 01/19/1976    Years since quitting: 48.2   Smokeless tobacco: Former  Building Services Engineer status: Never Used  Substance and Sexual Activity   Alcohol use: Yes    Comment: occasional   Drug use: No   Sexual activity: Not on file  Other Topics Concern   Not on file  Social History Narrative   Patient is an Art Gallery Manager. Married. Exercises 3 times a week for 1 hour and 15 minutes walking.   Social Drivers of Health   Tobacco Use: Medium Risk (03/29/2024)   Patient History    Smoking Tobacco Use: Former    Smokeless Tobacco Use: Former    Passive Exposure: Not on Actuary Strain: Low Risk (06/02/2021)   Received from [facility]   Overall Financial Resource Strain (CARDIA)    Difficulty of Paying Living Expenses: Not hard at all  Food Insecurity: Low Risk (01/09/2023)   Received from [facility]   Epic    Within the past 12 months, you worried that your food would run out before you got money to buy more: Never true    Within the past 12 months, the food you bought just didn't last and you didn't have money to get more. : Never true  Transportation Needs: No Transportation Needs (01/09/2023)   Received from [facility]    In the past 12 months, has lack of reliable transportation kept you from medical appointments, meetings, work or from getting things needed for daily living? : No  Physical Activity: Inactive (06/02/2021)   Received from [facility]   Exercise Vital Sign    On average, how many days per week do you  engage in moderate to strenuous exercise (like a brisk walk)?: 0 days    On average, how many minutes do you engage in exercise at this level?: 0 min  Stress: No Stress Concern Present (06/02/2021)   Received from [facility] - Occupational Stress Questionnaire    Feeling of Stress : Not at all  Social Connections: Socially Integrated (06/02/2021)   Received from [facility]   Social Connection and Isolation Panel    In a typical week, how many times do you talk on the phone with family, friends, or neighbors?: More than three times a week    How often do you get together with friends or relatives?: More than three times a week    How often do you attend church or religious services?: More than 4 times per year  Do you belong to any clubs or organizations such as church groups, unions, fraternal or athletic groups, or school groups?: Yes    How often do you attend meetings of the clubs or organizations you belong to?: More than 4 times per year    Are you married, widowed, divorced, separated, never married, or living with a partner?: Married  Depression (PHQ2-9): Not on file  Alcohol Screen: Not on file  Housing: Low Risk (01/09/2023)   Received from [facility]   Epic    What is your living situation today?: I have a steady place to live    Think about the place you live. Do you have problems with any of the following? Choose all that apply:: None/None on this list  Utilities: Low Risk (01/09/2023)   Received from [facility]   Utilities    In the past 12 months has the electric, gas, oil, or water company threatened to shut off services in your home? : No  Health Literacy: Not on file     Family History: The patient's family history includes Heart disease in his sister; Hyperlipidemia in his mother; Hypertension in his mother.  EKGs/Labs/Other Studies Reviewed:    The following studies were reviewed today:  EKG  Interpretation Date/Time:  Tuesday March 29 2024 10:48:38 EST Ventricular Rate:  61 PR Interval:  192 QRS Duration:  160 QT Interval:  444 QTC Calculation: 446 R Axis:   -53  Text Interpretation: Normal sinus rhythm Left bundle branch block When compared with ECG of 02-Feb-2023 10:24, PR interval has decreased QRS axis Shifted left T wave inversion no longer evident in Inferior leads T wave inversion more evident in Lateral leads No significant changes from prior studies Confirmed by Kerria Sapien [phone]) on 03/29/2024 11:00:22 AM    Recent Labs: No results found for requested labs within last 365 days.  Recent Lipid Panel    Component Value Date/Time   CHOL 138 08/15/2015 0351   TRIG 26 08/15/2015 0351   HDL 43 08/15/2015 0351   CHOLHDL 3.2 08/15/2015 0351   VLDL 5 08/15/2015 0351   LDLCALC 90 08/15/2015 0351          Physical Exam:    VS:  BP 116/68 (BP Location: Left Arm, Patient Position: Sitting, Cuff Size: Large)   Pulse 65   Resp 16   Ht 5' 7 (1.702 m)   Wt 210 lb 12.8 oz (95.6 kg)   SpO2 98%   BMI 33.02 kg/m        Wt Readings from Last 3 Encounters:  03/29/24 210 lb 12.8 oz (95.6 kg)  02/02/23 210 lb 6.4 oz (95.4 kg)  01/27/22 211 lb 12.8 oz (96.1 kg)     GEN:  Well nourished, well developed in no acute distress HEENT: Normal NECK:  No carotid bruits CARDIAC:  S1-S2 normal, RRR, no murmurs, rubs, gallops RESPIRATORY:  Clear to auscultation without rales, wheezing or rhonchi  MUSCULOSKELETAL: 1+ bilateral pitting LE edema; No deformity  SKIN: Warm and dry NEUROLOGIC:  Alert and oriented x 3 PSYCHIATRIC:  Normal affect       Assessment & Plan Coronary artery disease involving native coronary artery of native heart, unspecified whether angina present DOE (dyspnea on exertion) Stable angina 2017 inferior STEMI proximal RCA treated with overlapping DES X2 EKG: NSR, LBBB, no significant change from prior studies, 61 bpm Exertional chest pain,  DOE, relieved with rest.  States these are infrequent occurrences, not requiring the use of nitroglycerin , patient  is unsure if these are clinically significant.  Describes jaw pain similar to his previous STEMI occurring at random, lasting for seconds. Remains active around his house, likes to work outside, no formal exercise regimen. Denies any nitroglycerin  use Will order cardiac PET stress test to rule out new ischemia.  Imdur offered, patient declined. ED precautions given Daily aspirin  81 mg daily Continue Lipitor  80 mg daily Continue Lopressor  12.5 mg twice daily Continue nitroglycerin  0.4 mg SL tab PRN chest pain every 5 minutes Mixed hyperlipidemia Will need to consider updated lipid panel at follow up visit Continue atorvastatin  80 mg daily Continue Zetia  10 mg daily Essential hypertension BPs reported well-controlled at home Continue Lopressor  12.5 mg twice daily  Disposition: Follow-up after stress test.  Proceed to ED with any new, worsening, concerning symptoms. *** Route to primary cardiologist      Informed Consent   Shared Decision Making/Informed Consent{ All outpatient stress tests require an informed consent ([id number]) ATTESTATION ORDER       :[id number]} The risks [chest pain, shortness of breath, cardiac arrhythmias, dizziness, blood pressure fluctuations, myocardial infarction, stroke/transient ischemic attack, nausea, vomiting, allergic reaction, radiation exposure, metallic taste sensation and life-threatening complications (estimated to be 1 in 10,000)], benefits (risk stratification, diagnosing coronary artery disease, treatment guidance) and alternatives of a cardiac PET stress test were discussed in detail with Lance Hancock and he agrees to proceed.       Medication Adjustments/Labs and Tests Ordered: Current medicines are reviewed at length with the patient today.  Concerns regarding medicines are outlined above.  Orders Placed This Encounter  Procedures    NM PET CT CARDIAC PERFUSION MULTI W/ABSOLUTE BLOODFLOW   Cardiac Stress Test: Informed Consent Details: Physician/Practitioner Attestation; Transcribe to consent form and obtain patient signature   EKG 12-Lead   No orders of the defined types were placed in this encounter.   Patient Instructions  Medication Instructions:  Your physician recommends that you continue on your current medications as directed. Please refer to the Current Medication list given to you today.  *If you need a refill on your cardiac medications before your next appointment, please call your pharmacy*  Lab Work: None ordered If you have labs (blood work) drawn today and your tests are completely normal, you will receive your results only by: MyChart Message (if you have MyChart) OR A paper copy in the mail If you have any lab test that is abnormal or we need to change your treatment, we will call you to review the results.  Testing/Procedures:    Please report to Radiology at the [facility] Main Entrance 30 minutes early for your test.  [address] [place], [place] [address]                         OR   Please report to Radiology at [facility] Main Entrance, medical mall, 30 mins prior to your test.  [address]  [place], [place]  How to Prepare for Your Cardiac PET/CT Stress Test:  Nothing to eat or drink, except water, 3 hours prior to arrival time.  NO caffeine/decaffeinated products, or chocolate 12 hours prior to arrival. (Please note decaffeinated beverages (teas/coffees) still contain caffeine).  If you have caffeine within 12 hours prior, the test will need to be rescheduled.  Medication instructions: Do not take erectile dysfunction medications for 72 hours prior to test (sildenafil, tadalafil) Do not take nitrates (isosorbide mononitrate, Ranexa)  the day before or day of test Do not take tamsulosin the day before or morning of test Hold  theophylline containing medications for 12 hours. Hold Dipyridamole 48 hours prior to the test.  Diabetic Preparation: If able to eat breakfast prior to 3 hour fasting, you may take all medications, including your insulin. Do not worry if you miss your breakfast dose of insulin - start at your next meal. If you do not eat prior to 3 hour fast-Hold all diabetes (oral and insulin) medications. Patients who wear a continuous glucose monitor MUST remove the device prior to scanning.  You may take your remaining medications with water.  NO perfume, cologne or lotion on chest or abdomen area. FEMALES - Please avoid wearing dresses to this appointment.  Total time is 1 to 2 hours; you may want to bring reading material for the waiting time.  IF YOU THINK YOU MAY BE PREGNANT, OR ARE NURSING PLEASE INFORM THE TECHNOLOGIST.  In preparation for your appointment, medication and supplies will be purchased.  Appointment availability is limited, so if you need to cancel or reschedule, please call the Radiology Department Scheduler at [phone] 24 hours in advance to avoid a cancellation fee of $100.00  What to Expect When you Arrive:  Once you arrive and check in for your appointment, you will be taken to a preparation room within the Radiology Department.  A technologist or Nurse will obtain your medical history, verify that you are correctly prepped for the exam, and explain the procedure.  Afterwards, an IV will be started in your arm and electrodes will be placed on your skin for EKG monitoring during the stress portion of the exam. Then you will be escorted to the PET/CT scanner.  There, staff will get you positioned on the scanner and obtain a blood pressure and EKG.  During the exam, you will continue to be connected to the EKG and blood pressure machines.  A small, safe amount of a radioactive tracer will be injected in your IV to obtain a series of pictures of your heart along with an injection of  a stress agent.    After your Exam:  It is recommended that you eat a meal and drink a caffeinated beverage to counter act any effects of the stress agent.  Drink plenty of fluids for the remainder of the day and urinate frequently for the first couple of hours after the exam.  Your doctor will inform you of your test results within 7-10 business days.  For more information and frequently asked questions, please visit our website: [email or web address]  For questions about your test or how to prepare for your test, please call: Cardiac Imaging Nurse Navigators Office: [phone]   Follow-Up: At [facility], you and your health needs are our priority.  As part of our continuing mission to provide you with exceptional heart care, our providers are all part of one team.  This team includes your primary Cardiologist (physician) and Advanced Practice Providers or APPs (Physician Assistants and Nurse Practitioners) who all work together to provide you with the care you need, when you need it.  Your next appointment:   2 month(s)  Provider:   Ozell Fell, MD or Glendia Ferrier, PA-C          Signed, Miriam FORBES Shams, NP  03/29/2024 6:44 PM    [facility]     [1]  Current Meds  Medication Sig   aspirin  81 MG tablet Take 81 mg  by mouth daily.   atorvastatin  (LIPITOR ) 80 MG tablet TAKE 1 TABLET BY MOUTH EVERY DAY   Coenzyme Q10 200 MG capsule Take 200 mg by mouth daily.   ELDERBERRY PO Take 15 mLs by mouth daily.    ezetimibe  (ZETIA ) 10 MG tablet Take 1 tablet (10 mg total) by mouth daily.   GLUCOSAMINE-CHONDROITIN PO Take 1 tablet by mouth daily.    metoprolol  tartrate (LOPRESSOR ) 25 MG tablet TAKE 0.5 TABLETS BY MOUTH 2 TIMES DAILY.   Multiple Vitamin (MULTIVITAMIN) tablet Takes 1 tablespoon  Reliv vitamin And herbs by mouth daily   nitroGLYCERIN  (NITROSTAT ) 0.4 MG SL tablet DISSOLVE 1 TABLET UNDER THE TONGUE EVERY 5 MINUTES UP TO 3 DOSES AS NEEDED  FOR CHEST PAIN   Omega-3 Fatty Acids  (FISH OIL ULTRA) 1400 MG CAPS Take 1,400 mg by mouth 2 (two) times daily.   tamsulosin (FLOMAX) 0.4 MG CAPS capsule Take 0.4 mg by mouth daily.

## 2024-03-29 ENCOUNTER — Ambulatory Visit: Attending: Internal Medicine | Admitting: Physician Assistant

## 2024-03-29 ENCOUNTER — Ambulatory Visit: Admitting: Physician Assistant

## 2024-03-29 ENCOUNTER — Encounter: Payer: Self-pay | Admitting: Physician Assistant

## 2024-03-29 VITALS — BP 116/68 | HR 65 | Resp 16 | Ht 67.0 in | Wt 210.8 lb

## 2024-03-29 DIAGNOSIS — I2510 Atherosclerotic heart disease of native coronary artery without angina pectoris: Secondary | ICD-10-CM

## 2024-03-29 DIAGNOSIS — R0609 Other forms of dyspnea: Secondary | ICD-10-CM

## 2024-03-29 DIAGNOSIS — I10 Essential (primary) hypertension: Secondary | ICD-10-CM

## 2024-03-29 DIAGNOSIS — E782 Mixed hyperlipidemia: Secondary | ICD-10-CM

## 2024-03-29 DIAGNOSIS — I2089 Other forms of angina pectoris: Secondary | ICD-10-CM

## 2024-03-29 NOTE — Assessment & Plan Note (Signed)
 2017 inferior STEMI proximal RCA treated with overlapping DES X2 EKG: NSR, LBBB, no significant change from prior studies, 61 bpm Exertional chest pain, DOE, relieved with rest.  States these are infrequent occurrences, not requiring the use of nitroglycerin , patient is unsure if these are clinically significant.  Describes jaw pain similar to his previous STEMI occurring at random, lasting for seconds. Remains active around his house, likes to work outside, no formal exercise regimen. Denies any nitroglycerin  use Will order cardiac PET stress test to rule out new ischemia.  Imdur offered, patient declined. ED precautions given Daily aspirin  81 mg daily Continue Lipitor  80 mg daily Continue Lopressor  12.5 mg twice daily Continue nitroglycerin  0.4 mg SL tab PRN chest pain every 5 minutes

## 2024-03-29 NOTE — Patient Instructions (Signed)
 Medication Instructions:  Your physician recommends that you continue on your current medications as directed. Please refer to the Current Medication list given to you today.  *If you need a refill on your cardiac medications before your next appointment, please call your pharmacy*  Lab Work: None ordered If you have labs (blood work) drawn today and your tests are completely normal, you will receive your results only by: MyChart Message (if you have MyChart) OR A paper copy in the mail If you have any lab test that is abnormal or we need to change your treatment, we will call you to review the results.  Testing/Procedures:    Please report to Radiology at the [facility] Main Entrance 30 minutes early for your test.  [address] [place], [place] [address]                         OR   Please report to Radiology at [facility] Main Entrance, medical mall, 30 mins prior to your test.  [address]  [place], [place]  How to Prepare for Your Cardiac PET/CT Stress Test:  Nothing to eat or drink, except water, 3 hours prior to arrival time.  NO caffeine/decaffeinated products, or chocolate 12 hours prior to arrival. (Please note decaffeinated beverages (teas/coffees) still contain caffeine).  If you have caffeine within 12 hours prior, the test will need to be rescheduled.  Medication instructions: Do not take erectile dysfunction medications for 72 hours prior to test (sildenafil, tadalafil) Do not take nitrates (isosorbide mononitrate, Ranexa) the day before or day of test Do not take tamsulosin the day before or morning of test Hold theophylline containing medications for 12 hours. Hold Dipyridamole 48 hours prior to the test.  Diabetic Preparation: If able to eat breakfast prior to 3 hour fasting, you may take all medications, including your insulin. Do not worry if you miss your breakfast dose of insulin - start at your next meal. If  you do not eat prior to 3 hour fast-Hold all diabetes (oral and insulin) medications. Patients who wear a continuous glucose monitor MUST remove the device prior to scanning.  You may take your remaining medications with water.  NO perfume, cologne or lotion on chest or abdomen area. FEMALES - Please avoid wearing dresses to this appointment.  Total time is 1 to 2 hours; you may want to bring reading material for the waiting time.  IF YOU THINK YOU MAY BE PREGNANT, OR ARE NURSING PLEASE INFORM THE TECHNOLOGIST.  In preparation for your appointment, medication and supplies will be purchased.  Appointment availability is limited, so if you need to cancel or reschedule, please call the Radiology Department Scheduler at [phone] 24 hours in advance to avoid a cancellation fee of $100.00  What to Expect When you Arrive:  Once you arrive and check in for your appointment, you will be taken to a preparation room within the Radiology Department.  A technologist or Nurse will obtain your medical history, verify that you are correctly prepped for the exam, and explain the procedure.  Afterwards, an IV will be started in your arm and electrodes will be placed on your skin for EKG monitoring during the stress portion of the exam. Then you will be escorted to the PET/CT scanner.  There, staff will get you positioned on the scanner and obtain a blood pressure and EKG.  During the exam, you will continue to be connected  to the EKG and blood pressure machines.  A small, safe amount of a radioactive tracer will be injected in your IV to obtain a series of pictures of your heart along with an injection of a stress agent.    After your Exam:  It is recommended that you eat a meal and drink a caffeinated beverage to counter act any effects of the stress agent.  Drink plenty of fluids for the remainder of the day and urinate frequently for the first couple of hours after the exam.  Your doctor will inform you of  your test results within 7-10 business days.  For more information and frequently asked questions, please visit our website: [email or web address]  For questions about your test or how to prepare for your test, please call: Cardiac Imaging Nurse Navigators Office: [phone]   Follow-Up: At [facility], you and your health needs are our priority.  As part of our continuing mission to provide you with exceptional heart care, our providers are all part of one team.  This team includes your primary Cardiologist (physician) and Advanced Practice Providers or APPs (Physician Assistants and Nurse Practitioners) who all work together to provide you with the care you need, when you need it.  Your next appointment:   2 month(s)  Provider:   Ozell Fell, MD or Glendia Ferrier, PA-C

## 2024-03-29 NOTE — Assessment & Plan Note (Signed)
 Will need to consider updated lipid panel at follow up visit Continue atorvastatin  80 mg daily Continue Zetia  10 mg daily

## 2024-04-25 ENCOUNTER — Encounter (HOSPITAL_COMMUNITY): Payer: Self-pay

## 2024-04-26 ENCOUNTER — Telehealth (HOSPITAL_COMMUNITY): Payer: Self-pay | Admitting: Emergency Medicine

## 2024-04-26 NOTE — Telephone Encounter (Signed)
 Reaching out to patient to offer assistance regarding upcoming cardiac imaging study; pt verbalizes understanding of appt date/time, parking situation and where to check in, pre-test NPO status and medications ordered, and verified current allergies; name and call back number provided for further questions should they arise Rockwell Alexandria RN Navigator Cardiac Imaging [facility] [phone] office [phone] cell

## 2024-04-27 ENCOUNTER — Ambulatory Visit (HOSPITAL_COMMUNITY)
Admission: RE | Admit: 2024-04-27 | Discharge: 2024-04-27 | Disposition: A | Discharge status: Home / Self Care | Source: Ambulatory Visit | Attending: Emergency Medicine | Admitting: Emergency Medicine

## 2024-04-27 DIAGNOSIS — R0609 Other forms of dyspnea: Secondary | ICD-10-CM | POA: Diagnosis not present

## 2024-04-27 MED ORDER — REGADENOSON 0.4 MG/5ML IV SOLN
INTRAVENOUS | Status: AC
  Filled 2024-04-27: qty 5

## 2024-04-27 MED ORDER — RUBIDIUM RB82 GENERATOR (RUBYFILL)
24.4000 | PACK | Freq: Once | INTRAVENOUS | Status: AC
  Administered 2024-04-27: 24.4 via INTRAVENOUS

## 2024-04-27 MED ORDER — REGADENOSON 0.4 MG/5ML IV SOLN
0.4000 mg | Freq: Once | INTRAVENOUS | Status: AC
  Administered 2024-04-27: 0.4 mg via INTRAVENOUS

## 2024-04-28 LAB — NM PET CT CARDIAC PERFUSION MULTI W/ABSOLUTE BLOODFLOW
MBFR: 2.06
Nuc Rest EF: 43 %
Nuc Stress EF: 57 %
Rest MBF: 0.77 ml/g/min
Rest Nuclear Isotope Dose: 24.4 mCi
ST Depression (mm): 0 mm
Stress MBF: 1.59 ml/g/min
Stress Nuclear Isotope Dose: 24.4 mCi
TID: 1.17

## 2024-05-02 ENCOUNTER — Ambulatory Visit: Payer: Self-pay | Admitting: Emergency Medicine

## 2024-05-04 NOTE — Telephone Encounter (Signed)
 Spoke with pt regarding test results. Pt stated he has no symptoms of chest pain or jaw pain. Pt states his symptoms have improved since his last visit. Pt was advised to got to the ED if his symptoms worsen and f/u as planned. Pt agreed. Pt was advised to call our office if he has any questions or concerns.

## 2024-06-08 ENCOUNTER — Ambulatory Visit: Admitting: Physician Assistant
